# Patient Record
Sex: Male | Born: 1940 | Race: White | Hispanic: No | Marital: Married | Smoking: Former smoker
Health system: Southern US, Community
[De-identification: ages and names within clinical notes are randomized; demographics above are authoritative.]

## PROBLEM LIST (undated history)

## (undated) DIAGNOSIS — R5383 Other fatigue: Secondary | ICD-10-CM

## (undated) DIAGNOSIS — G8929 Other chronic pain: Secondary | ICD-10-CM

## (undated) DIAGNOSIS — C61 Malignant neoplasm of prostate: Secondary | ICD-10-CM

## (undated) DIAGNOSIS — H9190 Unspecified hearing loss, unspecified ear: Secondary | ICD-10-CM

## (undated) DIAGNOSIS — E785 Hyperlipidemia, unspecified: Secondary | ICD-10-CM

## (undated) DIAGNOSIS — I2510 Atherosclerotic heart disease of native coronary artery without angina pectoris: Secondary | ICD-10-CM

## (undated) DIAGNOSIS — I8290 Acute embolism and thrombosis of unspecified vein: Secondary | ICD-10-CM

## (undated) DIAGNOSIS — Z955 Presence of coronary angioplasty implant and graft: Secondary | ICD-10-CM

## (undated) DIAGNOSIS — E7800 Pure hypercholesterolemia, unspecified: Secondary | ICD-10-CM

## (undated) DIAGNOSIS — N529 Male erectile dysfunction, unspecified: Secondary | ICD-10-CM

## (undated) DIAGNOSIS — E119 Type 2 diabetes mellitus without complications: Secondary | ICD-10-CM

## (undated) DIAGNOSIS — I10 Essential (primary) hypertension: Secondary | ICD-10-CM

## (undated) DIAGNOSIS — G4730 Sleep apnea, unspecified: Secondary | ICD-10-CM

## (undated) DIAGNOSIS — M1990 Unspecified osteoarthritis, unspecified site: Secondary | ICD-10-CM

## (undated) DIAGNOSIS — R9721 Rising PSA following treatment for malignant neoplasm of prostate: Secondary | ICD-10-CM

## (undated) DIAGNOSIS — M21371 Foot drop, right foot: Secondary | ICD-10-CM

## (undated) DIAGNOSIS — H409 Unspecified glaucoma: Secondary | ICD-10-CM

## (undated) DIAGNOSIS — N401 Enlarged prostate with lower urinary tract symptoms: Secondary | ICD-10-CM

## (undated) DIAGNOSIS — I2089 Other forms of angina pectoris: Secondary | ICD-10-CM

## (undated) DIAGNOSIS — H269 Unspecified cataract: Secondary | ICD-10-CM

## (undated) DIAGNOSIS — R0609 Other forms of dyspnea: Secondary | ICD-10-CM

## (undated) DIAGNOSIS — E039 Hypothyroidism, unspecified: Secondary | ICD-10-CM

## (undated) DIAGNOSIS — I453 Trifascicular block: Secondary | ICD-10-CM

## (undated) DIAGNOSIS — N183 Chronic kidney disease, stage 3 unspecified: Secondary | ICD-10-CM

## (undated) DIAGNOSIS — K219 Gastro-esophageal reflux disease without esophagitis: Secondary | ICD-10-CM

## (undated) HISTORY — DX: Type 2 diabetes mellitus without complications: E11.9

## (undated) HISTORY — DX: Sleep apnea, unspecified: G47.30

## (undated) HISTORY — DX: Essential (primary) hypertension: I10

## (undated) HISTORY — PX: HERNIA REPAIR: SHX51

## (undated) HISTORY — PX: PROSTATE BIOPSY: SHX241

## (undated) HISTORY — DX: Malignant neoplasm of prostate: C61

## (undated) HISTORY — DX: Acute embolism and thrombosis of unspecified vein: I82.90

## (undated) HISTORY — PX: INGUINAL HERNIA REPAIR: SUR1180

## (undated) HISTORY — DX: Pure hypercholesterolemia, unspecified: E78.00

---

## 1988-11-24 HISTORY — PX: KNEE ARTHROSCOPY W/ MENISCAL REPAIR: SHX1877

## 1988-11-24 HISTORY — PX: KNEE SURGERY: SHX244

## 1999-03-06 DIAGNOSIS — G4700 Insomnia, unspecified: Secondary | ICD-10-CM | POA: Insufficient documentation

## 2000-05-28 DIAGNOSIS — I10 Essential (primary) hypertension: Secondary | ICD-10-CM | POA: Insufficient documentation

## 2000-12-09 DIAGNOSIS — G5600 Carpal tunnel syndrome, unspecified upper limb: Secondary | ICD-10-CM | POA: Insufficient documentation

## 2005-02-07 DIAGNOSIS — L649 Androgenic alopecia, unspecified: Secondary | ICD-10-CM | POA: Insufficient documentation

## 2005-03-11 DIAGNOSIS — K5730 Diverticulosis of large intestine without perforation or abscess without bleeding: Secondary | ICD-10-CM | POA: Insufficient documentation

## 2005-03-26 DIAGNOSIS — G4733 Obstructive sleep apnea (adult) (pediatric): Secondary | ICD-10-CM

## 2005-03-26 DIAGNOSIS — G4730 Sleep apnea, unspecified: Secondary | ICD-10-CM

## 2005-03-26 HISTORY — DX: Sleep apnea, unspecified: G47.30

## 2005-03-26 HISTORY — DX: Obstructive sleep apnea (adult) (pediatric): G47.33

## 2009-01-11 DIAGNOSIS — N529 Male erectile dysfunction, unspecified: Secondary | ICD-10-CM | POA: Insufficient documentation

## 2009-03-26 DIAGNOSIS — M21371 Foot drop, right foot: Secondary | ICD-10-CM

## 2009-03-26 HISTORY — PX: HIP SURGERY: SHX245

## 2009-03-26 HISTORY — PX: TOTAL HIP ARTHROPLASTY: SHX124

## 2009-03-26 HISTORY — DX: Foot drop, right foot: M21.371

## 2009-10-24 DIAGNOSIS — Z87828 Personal history of other (healed) physical injury and trauma: Secondary | ICD-10-CM

## 2009-10-24 DIAGNOSIS — Z86718 Personal history of other venous thrombosis and embolism: Secondary | ICD-10-CM

## 2009-10-24 DIAGNOSIS — Z8739 Personal history of other diseases of the musculoskeletal system and connective tissue: Secondary | ICD-10-CM

## 2009-10-24 HISTORY — DX: Personal history of other (healed) physical injury and trauma: Z87.828

## 2009-10-24 HISTORY — DX: Personal history of other venous thrombosis and embolism: Z86.718

## 2009-10-24 HISTORY — DX: Personal history of other diseases of the musculoskeletal system and connective tissue: Z87.39

## 2009-11-04 HISTORY — PX: ORIF ACETABULUM FRACTURE: SUR917

## 2009-12-23 HISTORY — PX: TOTAL HIP ARTHROPLASTY: SHX124

## 2012-03-26 HISTORY — PX: NASAL SINUS SURGERY: SHX719

## 2012-03-26 HISTORY — PX: NASAL SEPTUM SURGERY: SHX37

## 2012-03-26 HISTORY — PX: COLONOSCOPY: SHX174

## 2013-03-26 HISTORY — PX: CATARACT EXTRACTION W/ INTRAOCULAR LENS IMPLANT: SHX1309

## 2014-01-26 DIAGNOSIS — E039 Hypothyroidism, unspecified: Secondary | ICD-10-CM | POA: Insufficient documentation

## 2015-03-02 ENCOUNTER — Ambulatory Visit (INDEPENDENT_AMBULATORY_CARE_PROVIDER_SITE_OTHER): Payer: Medicare Other | Admitting: Urology

## 2015-03-02 DIAGNOSIS — C61 Malignant neoplasm of prostate: Secondary | ICD-10-CM | POA: Diagnosis not present

## 2015-03-02 DIAGNOSIS — R972 Elevated prostate specific antigen [PSA]: Secondary | ICD-10-CM | POA: Diagnosis not present

## 2015-04-01 ENCOUNTER — Encounter: Payer: Self-pay | Admitting: Radiation Oncology

## 2015-04-01 NOTE — Progress Notes (Signed)
GU Location of Tumor / Histology: prostatic adenocarcinoma  If Prostate Cancer, Gleason Score is (3 + 3) and PSA is (5.07)  Marco Brown was found to have an elevated PSA of 3.9 one year ago. Dr. Tressie Stalker referred the patient to Dr. Alyson Ingles when his PSA rose to 5.01 December 2014.  Biopsies of prostate (if applicable) revealed:    Past/Anticipated interventions by urology, if any: prostate biopsy, lengthy discussion about radiation versus prostatectomy  Past/Anticipated interventions by medical oncology, if any: no  Weight changes, if any: no  Bowel/Bladder complaints, if any: yes, mild LUTS. Reports intermittent and weak urine stream. Reports urgency. Denies hematuria or dysuria. Reports intermittent diarrhea.  Nausea/Vomiting, if any: no  Pain issues, if any:  no  SAFETY ISSUES:  Prior radiation? no  Pacemaker/ICD? no  Possible current pregnancy? no  Is the patient on methotrexate? no  Current Complaints / other details:  75 year old male. PROSTATE VOLUME: 41.8 cc. Married. Retired. Denies family hx of breast or prostate ca. Leaning toward seeds.

## 2015-04-04 ENCOUNTER — Ambulatory Visit
Admission: RE | Admit: 2015-04-04 | Discharge: 2015-04-04 | Disposition: A | Discharge status: Home / Self Care | Payer: Medicare Other | Source: Ambulatory Visit | Attending: Radiation Oncology | Admitting: Radiation Oncology

## 2015-04-04 ENCOUNTER — Telehealth: Payer: Self-pay | Admitting: Radiation Oncology

## 2015-04-04 ENCOUNTER — Encounter: Payer: Self-pay | Admitting: Radiation Oncology

## 2015-04-04 VITALS — BP 147/75 | HR 89 | Resp 16 | Wt 180.4 lb

## 2015-04-04 DIAGNOSIS — Z8546 Personal history of malignant neoplasm of prostate: Secondary | ICD-10-CM | POA: Insufficient documentation

## 2015-04-04 DIAGNOSIS — C61 Malignant neoplasm of prostate: Secondary | ICD-10-CM | POA: Diagnosis not present

## 2015-04-04 DIAGNOSIS — Z79899 Other long term (current) drug therapy: Secondary | ICD-10-CM | POA: Diagnosis not present

## 2015-04-04 DIAGNOSIS — Z9889 Other specified postprocedural states: Secondary | ICD-10-CM | POA: Insufficient documentation

## 2015-04-04 DIAGNOSIS — E7800 Pure hypercholesterolemia, unspecified: Secondary | ICD-10-CM | POA: Insufficient documentation

## 2015-04-04 DIAGNOSIS — Z87891 Personal history of nicotine dependence: Secondary | ICD-10-CM | POA: Insufficient documentation

## 2015-04-04 DIAGNOSIS — E119 Type 2 diabetes mellitus without complications: Secondary | ICD-10-CM | POA: Diagnosis not present

## 2015-04-04 DIAGNOSIS — Z96649 Presence of unspecified artificial hip joint: Secondary | ICD-10-CM | POA: Diagnosis not present

## 2015-04-04 DIAGNOSIS — I10 Essential (primary) hypertension: Secondary | ICD-10-CM | POA: Diagnosis not present

## 2015-04-04 DIAGNOSIS — Z7984 Long term (current) use of oral hypoglycemic drugs: Secondary | ICD-10-CM | POA: Insufficient documentation

## 2015-04-04 DIAGNOSIS — Z510 Encounter for antineoplastic radiation therapy: Secondary | ICD-10-CM | POA: Diagnosis not present

## 2015-04-04 NOTE — Telephone Encounter (Signed)
Confirmed consult appointment for today.

## 2015-04-04 NOTE — Progress Notes (Signed)
See progress note under physician encounter. 

## 2015-04-04 NOTE — Progress Notes (Signed)
Radiation Oncology         [phone] ________________________________  Initial Outpatient Consultation  Name: Marco Brown MRN: [id number]  Date: 04/04/2015  DOB: 1940-08-14  CC:No primary care provider on file.  McKenzie, Candee Furbish, MD   REFERRING PHYSICIAN: Cleon Gustin, MD  DIAGNOSIS: 75 y.o. gentleman with stage T1c adenocarcinoma of the prostate with a Gleason's score of 3+3 and a PSA of 5.07    ICD-9-CM ICD-10-CM   1. Malignant neoplasm of prostate ([place]) [place] ILLNESS::Marco Brown is a 75 y.o. gentleman.  He was noted to have an elevated PSA of 5.07 by his primary care physician, Dr. Denyce Robert.  Accordingly, he was referred for evaluation in urology by Dr. Doroteo Bradford on 01/20/15,  digital rectal examination was performed at that time revealing no nodules.  The patient proceeded to transrectal ultrasound with 12 biopsies of the prostate on 02/15/15.  The prostate volume measured 41.8 cc.  Out of 12 core biopsies, 6 were positive.  The maximum Gleason score was 3+3, and this was seen in all of the left sided specimens.  The patient reviewed the biopsy results with his urologist and he has kindly been referred today for discussion of potential radiation treatment options. He presents to the clinic with his wife.  PREVIOUS RADIATION THERAPY: No  PAST MEDICAL HISTORY:  has a past medical history of Prostate cancer ([place]); Diabetes mellitus without complication ([place]); Hypercholesterolemia; Hypertension; Sleep apnea; and Thrombus.    PAST SURGICAL HISTORY: Past Surgical History  Procedure Laterality Date  . Hernia repair    . Hip surgery    . Knee surgery    . Total hip arthroplasty    . Prostate biopsy      FAMILY HISTORY: family history includes Cancer in his father and paternal grandfather; Stroke in his father and mother.  SOCIAL HISTORY:  reports that he quit smoking about 50 years ago. His smoking use included Cigarettes, Cigars, and Pipe.  He smoked 1.50 packs per day. He has never used smokeless tobacco. He reports that he drinks alcohol. He reports that he does not use illicit drugs.  ALLERGIES: Review of patient's allergies indicates no known allergies.  MEDICATIONS:  Current Outpatient Prescriptions  Medication Sig Dispense Refill  . glimepiride (AMARYL) 2 MG tablet TK 1 AND 1/2 TS PO QD  3  . latanoprost (XALATAN) 0.005 % ophthalmic solution PLACE 1 DROP INTO OU HS  0  . levothyroxine (SYNTHROID, LEVOTHROID) 150 MCG tablet TK 1 T PO QD  3  . losartan-hydrochlorothiazide (HYZAAR) 100-12.5 MG tablet TK 1 T PO QD  0  . metFORMIN (GLUCOPHAGE-XR) 500 MG 24 hr tablet TK 1 T PO D  1  . pravastatin (PRAVACHOL) 20 MG tablet TK 1 T PO  QD WITH DINNER  2  . amoxicillin-clavulanate (AUGMENTIN) 875-125 MG tablet Reported on 04/04/2015  0   No current facility-administered medications for this encounter.    REVIEW OF SYSTEMS:  A 15 point review of systems is documented in the electronic medical record. This was obtained by the nursing staff. However, I reviewed this with the patient to discuss relevant findings and make appropriate changes.  Pertinent items are noted in HPI..  The patient completed an IPSS and IIEF questionnaire.  His IPSS score was 4 indicating mild urinary outflow obstructive symptoms.  He indicated that his erectile function is able to complete sexual activity about half the time.   PHYSICAL EXAM: This patient  is in no acute distress.  He is alert and oriented.   weight is 180 lb 6.4 oz (81.829 kg). His blood pressure is 147/75 and his pulse is 89. His respiration is 16 and oxygen saturation is 100%.  He exhibits no respiratory distress or labored breathing.  He appears neurologically intact.  His mood is pleasant.  His affect is appropriate.  Please note the digital rectal exam findings described above.  KPS = 100  100 - Normal; no complaints; no evidence of disease. 90   - Able to carry on normal activity; minor  signs or symptoms of disease. 80   - Normal activity with effort; some signs or symptoms of disease. 22   - Cares for self; unable to carry on normal activity or to do active work. 60   - Requires occasional assistance, but is able to care for most of his personal needs. 50   - Requires considerable assistance and frequent medical care. 43   - Disabled; requires special care and assistance. 61   - Severely disabled; hospital admission is indicated although death not imminent. 39   - Very sick; hospital admission necessary; active supportive treatment necessary. 10   - Moribund; fatal processes progressing rapidly. 0     - Dead  Karnofsky DA, Abelmann WH, Craver LS and Burchenal JH [phone]) The use of the nitrogen mustards in the palliative treatment of carcinoma: with particular reference to bronchogenic carcinoma Cancer 1 [phone]   LABORATORY DATA:  No results found for: WBC, HGB, HCT, MCV, PLT No results found for: NA, K, CL, CO2 No results found for: ALT, AST, GGT, ALKPHOS, BILITOT   RADIOGRAPHY: No results found.    IMPRESSION: This is a 75 y.o. gentleman with stage T1c adenocarcinoma of the prostate with a Gleason's score of 3+3 and a PSA of 5.07.  His T-Stage, Gleason's Score, and PSA put him into the favorable risk group.  Accordingly he is eligible for a variety of potential treatment options including active surveillance, prostatectomy, external beam radiation, or seed implant.  PLAN: Today I reviewed the findings and workup thus far. We discussed the natural history of prostate cancer.  We reviewed the the implications of T-stage, Gleason's Score, and PSA on decision-making and outcomes in prostate cancer.  We discussed radiation treatment in the management of prostate cancer with regard to the logistics and delivery of external beam radiation treatment as well as the logistics and delivery of prostate brachytherapy.  We compared and contrasted each of these approaches and also compared  these against prostatectomy.    He has yet to decide what treatment option he wants, but is leaning towards seed implant or active surveillance. He will follow-up with Dr. Alyson Ingles next week for further discussion. I will share my findings with Dr. Alyson Ingles. I gave him our phone number to contact us of his decision.  I enjoyed meeting with him today, and will look forward to participating in the care of this very nice gentleman.  I spent 30 minutes face to face with the patient and more than 50% of that time was spent in counseling and/or coordination of care.   ------------------------------------------------  Sheral Apley. Tammi Klippel, M.D.  This document serves as a record of services personally performed by Tyler Pita, MD. It was created on his behalf by Darcus Austin, a trained medical scribe. The creation of this record is based on the scribe's personal observations and the provider's statements to them. This document has been checked and approved  by the attending provider.

## 2015-04-04 NOTE — Progress Notes (Signed)
Patient denies incontinence or leakage. Denies hematuria or dysuria.

## 2015-04-29 ENCOUNTER — Telehealth: Payer: Self-pay | Admitting: *Deleted

## 2015-04-29 NOTE — Telephone Encounter (Signed)
CALLED PATIENT TO INFORM OF PRE-SEED APPT. FOR 05-20-15, SPOKE WITH PATIENT AND HE IS AWARE OF THIS APPT.

## 2015-05-09 ENCOUNTER — Other Ambulatory Visit: Payer: Self-pay | Admitting: Urology

## 2015-05-09 ENCOUNTER — Telehealth: Payer: Self-pay | Admitting: *Deleted

## 2015-05-09 NOTE — Telephone Encounter (Signed)
Called patient to inform of pre-seed appt. And his implant, lvm for a return call

## 2015-05-19 ENCOUNTER — Telehealth: Payer: Self-pay | Admitting: *Deleted

## 2015-05-19 NOTE — Telephone Encounter (Signed)
XXX

## 2015-05-19 NOTE — Telephone Encounter (Signed)
CALLED PATIENT TO REMIND OF APPTS. FOR 05-20-15, LVM FOR A RETURN CALL

## 2015-05-19 NOTE — Telephone Encounter (Deleted)
CALLED PATIENT TO REMIND OF APPTS. FOR 24

## 2015-05-19 NOTE — Telephone Encounter (Signed)
XXXX 

## 2015-05-20 ENCOUNTER — Ambulatory Visit
Admission: RE | Admit: 2015-05-20 | Discharge: 2015-05-20 | Disposition: A | Discharge status: Home / Self Care | Payer: Medicare Other | Source: Ambulatory Visit | Attending: Radiation Oncology | Admitting: Radiation Oncology

## 2015-05-20 ENCOUNTER — Other Ambulatory Visit: Payer: Self-pay

## 2015-05-20 ENCOUNTER — Ambulatory Visit
Admission: RE | Admit: 2015-05-20 | Discharge: 2015-05-20 | Disposition: A | Discharge status: Home / Self Care | Payer: 59 | Source: Ambulatory Visit | Attending: Radiation Oncology | Admitting: Radiation Oncology

## 2015-05-20 ENCOUNTER — Ambulatory Visit (HOSPITAL_COMMUNITY)
Admission: RE | Admit: 2015-05-20 | Discharge: 2015-05-20 | Disposition: A | Discharge status: Home / Self Care | Payer: Medicare Other | Source: Ambulatory Visit | Attending: Urology | Admitting: Urology

## 2015-05-20 ENCOUNTER — Encounter (HOSPITAL_BASED_OUTPATIENT_CLINIC_OR_DEPARTMENT_OTHER)
Admission: RE | Admit: 2015-05-20 | Discharge: 2015-05-20 | Disposition: A | Discharge status: Home / Self Care | Payer: Medicare Other | Source: Ambulatory Visit | Attending: Urology | Admitting: Urology

## 2015-05-20 DIAGNOSIS — Z01818 Encounter for other preprocedural examination: Secondary | ICD-10-CM | POA: Insufficient documentation

## 2015-05-20 DIAGNOSIS — C61 Malignant neoplasm of prostate: Secondary | ICD-10-CM | POA: Diagnosis not present

## 2015-05-20 DIAGNOSIS — Z029 Encounter for administrative examinations, unspecified: Secondary | ICD-10-CM | POA: Diagnosis not present

## 2015-05-20 NOTE — Progress Notes (Signed)
  Radiation Oncology         [phone]) [phone] ________________________________  Name: Marco Brown MRN: [id number]  Date: 05/20/2015  DOB: 14-Dec-1940  SIMULATION AND TREATMENT PLANNING NOTE PUBIC ARCH STUDY  CC:No primary care provider on file.  McKenzie, Candee Furbish, MD  DIAGNOSIS: 75 y.o. gentleman with stage T1c adenocarcinoma of the prostate with a Gleason's score of 3+3 and a PSA of 5.07      ICD-9-CM ICD-10-CM   1. Malignant neoplasm of prostate ([place]) [place]:  The patient presented today for evaluation for possible prostate seed implant. He was brought to the radiation planning suite and placed supine on the CT couch. A 3-dimensional image study set was obtained in upload to the planning computer. There, on each axial slice, I contoured the prostate gland. Then, using three-dimensional radiation planning tools I reconstructed the prostate in view of the structures from the transperineal needle pathway to assess for possible pubic arch interference. In doing so, I did not appreciate any pubic arch interference. Also, the patient's prostate volume was estimated based on the drawn structure. The volume was 41 cc.  Given the pubic arch appearance and prostate volume, patient remains a good candidate to proceed with prostate seed implant. Today, he freely provided informed written consent to proceed.    PLAN: The patient will undergo prostate seed implant.   ________________________________  Sheral Apley. Tammi Klippel, M.D.   This document serves as a record of services personally performed by Tyler Pita, MD. It was created on his behalf by Arlyce Harman, a trained medical scribe. The creation of this record is based on the scribe's personal observations and the provider's statements to them. This document has been checked and approved by the attending provider.

## 2015-07-18 ENCOUNTER — Encounter (HOSPITAL_BASED_OUTPATIENT_CLINIC_OR_DEPARTMENT_OTHER): Payer: Self-pay | Admitting: *Deleted

## 2015-07-18 NOTE — Progress Notes (Signed)
To [facility] at 0800-Ekg,CXR with chart-plans visit  07/21/15 for pre- op lab work.Instructed Npo after Mn-will take levothyroxine with water-to complete fleet enema prior to arrival am of procedure.

## 2015-07-20 ENCOUNTER — Telehealth: Payer: Self-pay | Admitting: *Deleted

## 2015-07-20 NOTE — Telephone Encounter (Signed)
CALLED PATIENT TO REMIND OF LAB FOR IMPLANT (LAB TO BE DONE ON 07-21-15 FOR IMPLANT ON 07-28-15), SPOKE WITH PATIENT'S WIFE AND SHE IS AWARE OF THIS LAB.

## 2015-07-21 DIAGNOSIS — Z7984 Long term (current) use of oral hypoglycemic drugs: Secondary | ICD-10-CM | POA: Diagnosis not present

## 2015-07-21 DIAGNOSIS — I10 Essential (primary) hypertension: Secondary | ICD-10-CM | POA: Diagnosis not present

## 2015-07-21 DIAGNOSIS — C61 Malignant neoplasm of prostate: Secondary | ICD-10-CM | POA: Diagnosis present

## 2015-07-21 DIAGNOSIS — Z791 Long term (current) use of non-steroidal anti-inflammatories (NSAID): Secondary | ICD-10-CM | POA: Diagnosis not present

## 2015-07-21 DIAGNOSIS — Z87891 Personal history of nicotine dependence: Secondary | ICD-10-CM | POA: Diagnosis not present

## 2015-07-21 DIAGNOSIS — Z86718 Personal history of other venous thrombosis and embolism: Secondary | ICD-10-CM | POA: Diagnosis not present

## 2015-07-21 DIAGNOSIS — E7800 Pure hypercholesterolemia, unspecified: Secondary | ICD-10-CM | POA: Diagnosis not present

## 2015-07-21 DIAGNOSIS — Z79899 Other long term (current) drug therapy: Secondary | ICD-10-CM | POA: Diagnosis not present

## 2015-07-21 DIAGNOSIS — E119 Type 2 diabetes mellitus without complications: Secondary | ICD-10-CM | POA: Diagnosis not present

## 2015-07-21 DIAGNOSIS — Z8547 Personal history of malignant neoplasm of testis: Secondary | ICD-10-CM | POA: Diagnosis not present

## 2015-07-21 DIAGNOSIS — Z96641 Presence of right artificial hip joint: Secondary | ICD-10-CM | POA: Diagnosis not present

## 2015-07-21 DIAGNOSIS — Z7982 Long term (current) use of aspirin: Secondary | ICD-10-CM | POA: Diagnosis not present

## 2015-07-21 DIAGNOSIS — G4730 Sleep apnea, unspecified: Secondary | ICD-10-CM | POA: Diagnosis not present

## 2015-07-21 LAB — CBC
HEMATOCRIT: 39.5 % (ref 39.0–52.0)
Hemoglobin: 13.3 g/dL (ref 13.0–17.0)
MCH: 29.3 pg (ref 26.0–34.0)
MCHC: 33.7 g/dL (ref 30.0–36.0)
MCV: 87 fL (ref 78.0–100.0)
Platelets: 152 10*3/uL (ref 150–400)
RBC: 4.54 MIL/uL (ref 4.22–5.81)
RDW: 13 % (ref 11.5–15.5)
WBC: 6.2 10*3/uL (ref 4.0–10.5)

## 2015-07-21 LAB — COMPREHENSIVE METABOLIC PANEL
ALT: 20 U/L (ref 17–63)
AST: 21 U/L (ref 15–41)
Albumin: 4.2 g/dL (ref 3.5–5.0)
Alkaline Phosphatase: 47 U/L (ref 38–126)
Anion gap: 9 (ref 5–15)
BILIRUBIN TOTAL: 0.3 mg/dL (ref 0.3–1.2)
BUN: 15 mg/dL (ref 6–20)
CO2: 25 mmol/L (ref 22–32)
CREATININE: 1.2 mg/dL (ref 0.61–1.24)
Calcium: 8.8 mg/dL — ABNORMAL LOW (ref 8.9–10.3)
Chloride: 105 mmol/L (ref 101–111)
GFR, EST NON AFRICAN AMERICAN: 58 mL/min — AB (ref >60)
Glucose, Bld: 211 mg/dL — ABNORMAL HIGH (ref 65–99)
POTASSIUM: 4.2 mmol/L (ref 3.5–5.1)
Sodium: 139 mmol/L (ref 135–145)
TOTAL PROTEIN: 6.7 g/dL (ref 6.5–8.1)

## 2015-07-21 LAB — APTT: APTT: 28 s (ref 24–37)

## 2015-07-21 LAB — PROTIME-INR
INR: 1.07 (ref 0.00–1.49)
Prothrombin Time: 14.1 seconds (ref 11.6–15.2)

## 2015-07-27 ENCOUNTER — Ambulatory Visit: Admission: RE | Admit: 2015-07-27 | Payer: Medicare Other | Source: Ambulatory Visit

## 2015-07-27 ENCOUNTER — Telehealth: Payer: Self-pay | Admitting: *Deleted

## 2015-07-27 NOTE — Telephone Encounter (Signed)
CALLED PATIENT TO REMIND OF PROCEDURE FOR 07-28-15, SPOKE WITH PATIENT'S WIFE- GAIL AND SHE IS AWARE OF THIS PROCEDURE.

## 2015-07-28 ENCOUNTER — Encounter (HOSPITAL_BASED_OUTPATIENT_CLINIC_OR_DEPARTMENT_OTHER)
Admission: RE | Disposition: A | Discharge status: Home / Self Care | Payer: Self-pay | Source: Ambulatory Visit | Attending: Urology

## 2015-07-28 ENCOUNTER — Encounter (HOSPITAL_BASED_OUTPATIENT_CLINIC_OR_DEPARTMENT_OTHER): Payer: Self-pay | Admitting: Anesthesiology

## 2015-07-28 ENCOUNTER — Ambulatory Visit (HOSPITAL_BASED_OUTPATIENT_CLINIC_OR_DEPARTMENT_OTHER): Payer: Medicare Other | Admitting: Anesthesiology

## 2015-07-28 ENCOUNTER — Ambulatory Visit (HOSPITAL_COMMUNITY): Payer: Medicare Other

## 2015-07-28 ENCOUNTER — Ambulatory Visit (HOSPITAL_BASED_OUTPATIENT_CLINIC_OR_DEPARTMENT_OTHER)
Admission: RE | Admit: 2015-07-28 | Discharge: 2015-07-28 | Disposition: A | Discharge status: Home / Self Care | Payer: Medicare Other | Source: Ambulatory Visit | Attending: Urology | Admitting: Urology

## 2015-07-28 DIAGNOSIS — G4730 Sleep apnea, unspecified: Secondary | ICD-10-CM | POA: Diagnosis not present

## 2015-07-28 DIAGNOSIS — Z87891 Personal history of nicotine dependence: Secondary | ICD-10-CM | POA: Insufficient documentation

## 2015-07-28 DIAGNOSIS — C61 Malignant neoplasm of prostate: Secondary | ICD-10-CM | POA: Diagnosis not present

## 2015-07-28 DIAGNOSIS — Z01818 Encounter for other preprocedural examination: Secondary | ICD-10-CM

## 2015-07-28 DIAGNOSIS — Z791 Long term (current) use of non-steroidal anti-inflammatories (NSAID): Secondary | ICD-10-CM | POA: Insufficient documentation

## 2015-07-28 DIAGNOSIS — Z79899 Other long term (current) drug therapy: Secondary | ICD-10-CM | POA: Insufficient documentation

## 2015-07-28 DIAGNOSIS — Z7984 Long term (current) use of oral hypoglycemic drugs: Secondary | ICD-10-CM | POA: Insufficient documentation

## 2015-07-28 DIAGNOSIS — Z7982 Long term (current) use of aspirin: Secondary | ICD-10-CM | POA: Insufficient documentation

## 2015-07-28 DIAGNOSIS — E119 Type 2 diabetes mellitus without complications: Secondary | ICD-10-CM | POA: Diagnosis not present

## 2015-07-28 DIAGNOSIS — Z86718 Personal history of other venous thrombosis and embolism: Secondary | ICD-10-CM | POA: Insufficient documentation

## 2015-07-28 DIAGNOSIS — I10 Essential (primary) hypertension: Secondary | ICD-10-CM | POA: Insufficient documentation

## 2015-07-28 DIAGNOSIS — Z8547 Personal history of malignant neoplasm of testis: Secondary | ICD-10-CM | POA: Insufficient documentation

## 2015-07-28 DIAGNOSIS — E7800 Pure hypercholesterolemia, unspecified: Secondary | ICD-10-CM | POA: Insufficient documentation

## 2015-07-28 DIAGNOSIS — Z96641 Presence of right artificial hip joint: Secondary | ICD-10-CM | POA: Insufficient documentation

## 2015-07-28 HISTORY — PX: CYSTOSCOPY: SHX5120

## 2015-07-28 HISTORY — DX: Unspecified cataract: H26.9

## 2015-07-28 HISTORY — PX: RADIOACTIVE SEED IMPLANT: SHX5150

## 2015-07-28 LAB — GLUCOSE, CAPILLARY
GLUCOSE-CAPILLARY: 211 mg/dL — AB (ref 65–99)
Glucose-Capillary: 201 mg/dL — ABNORMAL HIGH (ref 65–99)

## 2015-07-28 SURGERY — INSERTION, RADIATION SOURCE, PROSTATE
Anesthesia: General | Site: Prostate

## 2015-07-28 MED ORDER — LIDOCAINE HCL (CARDIAC) 20 MG/ML IV SOLN
INTRAVENOUS | Status: DC | PRN
  Administered 2015-07-28: 80 mg via INTRAVENOUS

## 2015-07-28 MED ORDER — ACETAMINOPHEN 325 MG PO TABS
325.0000 mg | ORAL_TABLET | ORAL | Status: DC | PRN
  Filled 2015-07-28: qty 2

## 2015-07-28 MED ORDER — ACETAMINOPHEN 160 MG/5ML PO SOLN
325.0000 mg | ORAL | Status: DC | PRN
  Filled 2015-07-28: qty 20.3

## 2015-07-28 MED ORDER — FLEET ENEMA 7-19 GM/118ML RE ENEM
1.0000 | ENEMA | Freq: Once | RECTAL | Status: AC
  Administered 2015-07-28: 1 via RECTAL
  Filled 2015-07-28: qty 1

## 2015-07-28 MED ORDER — KETOROLAC TROMETHAMINE 30 MG/ML IJ SOLN
INTRAMUSCULAR | Status: DC | PRN
  Administered 2015-07-28: 30 mg via INTRAVENOUS

## 2015-07-28 MED ORDER — PROPOFOL 10 MG/ML IV BOLUS
INTRAVENOUS | Status: AC
  Filled 2015-07-28: qty 20

## 2015-07-28 MED ORDER — FENTANYL CITRATE (PF) 100 MCG/2ML IJ SOLN
25.0000 ug | INTRAMUSCULAR | Status: DC | PRN
  Filled 2015-07-28: qty 1

## 2015-07-28 MED ORDER — LACTATED RINGERS IV SOLN
INTRAVENOUS | Status: DC
  Administered 2015-07-28: 09:00:00 via INTRAVENOUS
  Filled 2015-07-28: qty 1000

## 2015-07-28 MED ORDER — TRAMADOL HCL 50 MG PO TABS: 50.0000 mg | ORAL_TABLET | Freq: Four times a day (QID) | ORAL | Status: DC | PRN

## 2015-07-28 MED ORDER — ONDANSETRON HCL 4 MG/2ML IJ SOLN
INTRAMUSCULAR | Status: AC
  Filled 2015-07-28: qty 2

## 2015-07-28 MED ORDER — ONDANSETRON HCL 4 MG/2ML IJ SOLN
INTRAMUSCULAR | Status: DC | PRN
  Administered 2015-07-28: 4 mg via INTRAVENOUS

## 2015-07-28 MED ORDER — OXYCODONE HCL 5 MG PO TABS
5.0000 mg | ORAL_TABLET | Freq: Once | ORAL | Status: DC | PRN
  Filled 2015-07-28: qty 1

## 2015-07-28 MED ORDER — LIDOCAINE HCL (CARDIAC) 20 MG/ML IV SOLN
INTRAVENOUS | Status: AC
  Filled 2015-07-28: qty 5

## 2015-07-28 MED ORDER — FENTANYL CITRATE (PF) 100 MCG/2ML IJ SOLN
INTRAMUSCULAR | Status: AC
  Filled 2015-07-28: qty 2

## 2015-07-28 MED ORDER — OXYCODONE HCL 5 MG/5ML PO SOLN
5.0000 mg | Freq: Once | ORAL | Status: DC | PRN
  Filled 2015-07-28: qty 5

## 2015-07-28 MED ORDER — EPHEDRINE SULFATE 50 MG/ML IJ SOLN
INTRAMUSCULAR | Status: DC | PRN
  Administered 2015-07-28 (×3): 10 mg via INTRAVENOUS

## 2015-07-28 MED ORDER — PROPOFOL 10 MG/ML IV BOLUS
INTRAVENOUS | Status: DC | PRN
  Administered 2015-07-28: 200 mg via INTRAVENOUS

## 2015-07-28 MED ORDER — GENTAMICIN SULFATE 40 MG/ML IJ SOLN
5.0000 mg/kg | INTRAMUSCULAR | Status: AC
  Administered 2015-07-28: 400 mg via INTRAVENOUS
  Filled 2015-07-28: qty 10

## 2015-07-28 MED ORDER — FENTANYL CITRATE (PF) 100 MCG/2ML IJ SOLN
INTRAMUSCULAR | Status: DC | PRN
  Administered 2015-07-28 (×4): 25 ug via INTRAVENOUS

## 2015-07-28 MED ORDER — GENTAMICIN SULFATE 40 MG/ML IJ SOLN
80.0000 mg | Freq: Once | INTRAVENOUS | Status: DC
  Filled 2015-07-28: qty 2

## 2015-07-28 SURGICAL SUPPLY — 35 items
BAG URINE DRAINAGE (UROLOGICAL SUPPLIES) ×4 IMPLANT
BLADE CLIPPER SURG (BLADE) ×4 IMPLANT
CATH FOLEY 2WAY SLVR  5CC 16FR (CATHETERS) ×2
CATH FOLEY 2WAY SLVR 5CC 16FR (CATHETERS) ×2 IMPLANT
CATH ROBINSON RED A/P 20FR (CATHETERS) ×4 IMPLANT
CLOTH BEACON ORANGE TIMEOUT ST (SAFETY) ×4 IMPLANT
COVER BACK TABLE 60X90IN (DRAPES) ×4 IMPLANT
COVER MAYO STAND STRL (DRAPES) ×4 IMPLANT
DRSG TEGADERM 4X4.75 (GAUZE/BANDAGES/DRESSINGS) ×4 IMPLANT
DRSG TEGADERM 8X12 (GAUZE/BANDAGES/DRESSINGS) ×4 IMPLANT
GLOVE BIO SURGEON STRL SZ 6.5 (GLOVE) ×3 IMPLANT
GLOVE BIO SURGEON STRL SZ7.5 (GLOVE) IMPLANT
GLOVE BIO SURGEON STRL SZ8 (GLOVE) ×8 IMPLANT
GLOVE BIO SURGEONS STRL SZ 6.5 (GLOVE) ×1
GLOVE BIOGEL PI IND STRL 6.5 (GLOVE) ×2 IMPLANT
GLOVE BIOGEL PI IND STRL 7.5 (GLOVE) ×2 IMPLANT
GLOVE BIOGEL PI INDICATOR 6.5 (GLOVE) ×2
GLOVE BIOGEL PI INDICATOR 7.5 (GLOVE) ×2
GLOVE ECLIPSE 8.0 STRL XLNG CF (GLOVE) IMPLANT
GOWN STRL REUS W/ TWL LRG LVL3 (GOWN DISPOSABLE) ×2 IMPLANT
GOWN STRL REUS W/ TWL XL LVL3 (GOWN DISPOSABLE) ×2 IMPLANT
GOWN STRL REUS W/TWL LRG LVL3 (GOWN DISPOSABLE) ×6 IMPLANT
GOWN STRL REUS W/TWL XL LVL3 (GOWN DISPOSABLE) ×2
HOLDER FOLEY CATH W/STRAP (MISCELLANEOUS) ×4 IMPLANT
KIT ROOM TURNOVER WOR (KITS) ×4 IMPLANT
MANIFOLD NEPTUNE II (INSTRUMENTS) IMPLANT
NUCLETRON SELECTSEED 1-125 ×300 IMPLANT
PACK CYSTO (CUSTOM PROCEDURE TRAY) ×4 IMPLANT
SPONGE GAUZE 4X4 12PLY STER LF (GAUZE/BANDAGES/DRESSINGS) ×4 IMPLANT
SYRINGE 10CC LL (SYRINGE) ×4 IMPLANT
TUBE CONNECTING 12'X1/4 (SUCTIONS)
TUBE CONNECTING 12X1/4 (SUCTIONS) IMPLANT
UNDERPAD 30X30 INCONTINENT (UNDERPADS AND DIAPERS) ×8 IMPLANT
WATER STERILE IRR 3000ML UROMA (IV SOLUTION) ×4 IMPLANT
WATER STERILE IRR 500ML POUR (IV SOLUTION) ×4 IMPLANT

## 2015-07-28 NOTE — Anesthesia Preprocedure Evaluation (Signed)
Anesthesia Evaluation  Patient identified by MRN, date of birth, ID band Patient awake    Reviewed: Allergy & Precautions, NPO status , Patient's Chart, lab work & pertinent test results  History of Anesthesia Complications Negative for: history of anesthetic complications  Airway Mallampati: I  TM Distance: >3 FB Neck ROM: Full    Dental  (+) Teeth Intact   Pulmonary neg shortness of breath, sleep apnea , neg COPD, former smoker,    breath sounds clear to auscultation       Cardiovascular hypertension, Pt. on medications (-) angina(-) CHF  Rhythm:Regular     Neuro/Psych Right foot drop  Neuromuscular disease negative psych ROS   GI/Hepatic negative GI ROS, Neg liver ROS,   Endo/Other  diabetes, Type 2, Oral Hypoglycemic AgentsHypothyroidism   Renal/GU negative Renal ROS     Musculoskeletal   Abdominal   Peds  Hematology   Anesthesia Other Findings   Reproductive/Obstetrics                             Anesthesia Physical Anesthesia Plan  ASA: II  Anesthesia Plan: General   Post-op Pain Management:    Induction: Intravenous  Airway Management Planned: LMA and Oral ETT  Additional Equipment: None  Intra-op Plan:   Post-operative Plan: Extubation in OR  Informed Consent: I have reviewed the patients History and Physical, chart, labs and discussed the procedure including the risks, benefits and alternatives for the proposed anesthesia with the patient or authorized representative who has indicated his/her understanding and acceptance.   Dental advisory given  Plan Discussed with: CRNA and Surgeon  Anesthesia Plan Comments:         Anesthesia Quick Evaluation

## 2015-07-28 NOTE — Discharge Instructions (Signed)
Post Anesthesia Home Care Instructions  Activity: Get plenty of rest for the remainder of the day. A responsible adult should stay with you for 24 hours following the procedure.  For the next 24 hours, DO NOT: -Drive a car -Paediatric nurse -Drink alcoholic beverages -Take any medication unless instructed by your physician -Make any legal decisions or sign important papers.  Meals: Start with liquid foods such as gelatin or soup. Progress to regular foods as tolerated. Avoid greasy, spicy, heavy foods. If nausea and/or vomiting occur, drink only clear liquids until the nausea and/or vomiting subsides. Call your physician if vomiting continues.  Special Instructions/Symptoms: Your throat may feel dry or sore from the anesthesia or the breathing tube placed in your throat during surgery. If this causes discomfort, gargle with warm salt water. The discomfort should disappear within 24 hours.  If you had a scopolamine patch placed behind your ear for the management of post- operative nausea and/or vomiting:  1. The medication in the patch is effective for 72 hours, after which it should be removed.  Wrap patch in a tissue and discard in the trash. Wash hands thoroughly with soap and water. 2. You may remove the patch earlier than 72 hours if you experience unpleasant side effects which may include dry mouth, dizziness or visual disturbances. 3. Avoid touching the patch. Wash your hands with soap and water after contact with the patch.   Brachytherapy for Prostate Cancer, Care After Refer to this sheet in the next few weeks. These instructions provide you with information on caring for yourself after your procedure. Your health care provider may also give you more specific instructions. Your treatment has been planned according to current medical practices, but problems sometimes occur. Call your health care provider if you have any problems or questions after your procedure. WHAT TO EXPECT  AFTER THE PROCEDURE The area behind the scrotum will probably be tender and bruised. For a short period of time you may have:  Difficulty passing urine. You may need a catheter for a few days to a month.  Blood in the urine or semen.  A feeling of constipation because of prostate swelling.  Frequent feeling of an urgent need to urinate. For a long period of time you may have:  Inflammation of the rectum. This happens in about 2% of people who have the procedure.  Erection problems. These vary with age and occur in about 15-40% of men.  Difficulty urinating. This is caused by scarring in the urethra.  Diarrhea. HOME CARE INSTRUCTIONS   Take medicines only as directed by your health care provider.  You will probably have a catheter in your bladder for several days. You will have blood in the urine bag and should drink a lot of fluids to keep it a light red color.  Keep all follow-up visits as directed by your health care provider. If you have a catheter, it will be removed during one of these visits.  Try not to sit directly on the area behind the scrotum. A soft cushion can decrease the discomfort. Ice packs may also be helpful for the discomfort. Do not put ice directly on the skin.  Shower and wash the area behind the scrotum gently. Do not sit in a tub.  If you have had the brachytherapy that uses the seeds, limit your close contact with children and pregnant women for 2 months because of the radiation still in the prostate. After that period of time, the levels drop off  quickly. SEEK IMMEDIATE MEDICAL CARE IF:   You have a fever.  You have chills.  You have shortness of breath.  You have chest pain.  You have thick blood, like tomato juice, in the urine bag.  Your catheter is blocked so urine cannot get into the bag. Your bladder area or lower abdomen may be swollen.  There is excessive bleeding from your rectum. It is normal to have a little blood mixed with your  stool.  There is severe discomfort in the treated area that does not go away with pain medicine.  You have abdominal discomfort.  You have severe nausea or vomiting.  You develop any new or unusual symptoms.   This information is not intended to replace advice given to you by your health care provider. Make sure you discuss any questions you have with your health care provider.   Document Released: 04/14/2010 Document Revised: 04/02/2014 Document Reviewed: 09/02/2012 Elsevier Interactive Patient Education [employer].

## 2015-07-28 NOTE — Brief Op Note (Signed)
07/28/2015  11:00 AM  PATIENT:  Marco Brown  75 y.o. male  PRE-OPERATIVE DIAGNOSIS:  PROSTATE CANCER  POST-OPERATIVE DIAGNOSIS:  PROSTATE CANCER  PROCEDURE:  Procedure(s) with comments: RADIOACTIVE SEED IMPLANT/BRACHYTHERAPY IMPLANT (N/A) -   76     SEEDS IMPLANTED CYSTOSCOPY (N/A) - NO SEEDS FOUND IN BLADDER  SURGEON:  Surgeon(s) and Role:    * Cleon Gustin, MD - Primary    * Tyler Pita, MD - Assisting  PHYSICIAN ASSISTANT:   ASSISTANTS: none   ANESTHESIA:   general  EBL:  Total I/O In: 200 [I.V.:200] Out: 200 [Urine:200]  BLOOD ADMINISTERED:none  DRAINS: Urinary Catheter (Foley)   LOCAL MEDICATIONS USED:  NONE  SPECIMEN:  No Specimen  DISPOSITION OF SPECIMEN:  N/A  COUNTS:  YES  TOURNIQUET:  * No tourniquets in log *  DICTATION: .Note written in EPIC  PLAN OF CARE: Discharge to home after PACU  PATIENT DISPOSITION:  PACU - hemodynamically stable.   Delay start of Pharmacological VTE agent (>24hrs) due to surgical blood loss or risk of bleeding: not applicable
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
## 2015-07-28 NOTE — Progress Notes (Signed)
  Radiation Oncology         [phone] ________________________________  Name: Marco Brown MRN: [id number]  Date: 07/29/2015  DOB: [birth year]       Prostate Seed Implant  CC:No primary care provider on file.  No ref. provider found  DIAGNOSIS: 75 y.o. gentleman with stage T1c adenocarcinoma of the prostate with a Gleason's score of 3+3 and a PSA of 5.07    ICD-9-CM ICD-10-CM   1. Pre-op testing V72.84 Z01.818 DG Chest 2 View     DG Chest 2 View    PROCEDURE: Insertion of radioactive I-125 seeds into the prostate gland.  RADIATION DOSE: 145 Gy, definitive  TECHNIQUE: Marco Brown was brought to the operating room with the urologist. He was placed in the dorsolithotomy position. He was catheterized and a rectal tube was inserted. The perineum was shaved, prepped and draped. The ultrasound probe was then introduced into the rectum to see the prostate gland.  TREATMENT DEVICE: A needle grid was attached to the ultrasound probe stand and anchor needles were placed.  3D PLANNING: The prostate was imaged in 3D using a sagittal sweep of the prostate probe. These images were transferred to the planning computer. There, the prostate, urethra and rectum were defined on each axial reconstructed image. Then, the software created an optimized 3D plan and a few seed positions were adjusted. The quality of the plan was reviewed using [facility] information for the target and the following two organs at risk:  Urethra and Rectum.  Then the accepted plan was uploaded to the seed Selectron afterloading unit.  PROSTATE VOLUME STUDY:  Using transrectal ultrasound the volume of the prostate was verified to be 50 cc.  SPECIAL TREATMENT PROCEDURE/SUPERVISION AND HANDLING: The Nucletron FIRST system was used to place the needles under sagittal guidance. A total of 21 needles were used to deposit 75 seeds in the prostate gland. The individual seed activity was 0.567 mCi.  COMPLEX SIMULATION: At the end of the  procedure, an anterior radiograph of the pelvis was obtained to document seed positioning and count. Cystoscopy was performed to check the urethra and bladder.  MICRODOSIMETRY: At the end of the procedure, the patient was emitting 0.23 mR/hr at 1 meter. Accordingly, he was considered safe for hospital discharge.  PLAN: The patient will return to the radiation oncology clinic for post implant CT dosimetry in three weeks.   ________________________________  Sheral Apley Tammi Klippel, M.D.

## 2015-07-28 NOTE — Anesthesia Procedure Notes (Signed)
Procedure Name: LMA Insertion Date/Time: 07/28/2015 9:36 AM Performed by: Justice Rocher Pre-anesthesia Checklist: Patient identified, Emergency Drugs available, Suction available and Patient being monitored Patient Re-evaluated:Patient Re-evaluated prior to inductionOxygen Delivery Method: Circle System Utilized Preoxygenation: Pre-oxygenation with 100% oxygen Intubation Type: IV induction Ventilation: Mask ventilation without difficulty LMA: LMA inserted LMA Size: 5.0 Number of attempts: 1 Airway Equipment and Method: Bite block Placement Confirmation: positive ETCO2 Tube secured with: Tape Dental Injury: Teeth and Oropharynx as per pre-operative assessment

## 2015-07-28 NOTE — Op Note (Signed)
PRE-OPERATIVE DIAGNOSIS:  Adenocarcinoma of the prostate, T1c  POST-OPERATIVE DIAGNOSIS:  Same  PROCEDURE:  Procedure(s): 1. I-125 radioactive seed implantation 2. Cystoscopy  SURGEON:  Surgeon(s): Nicolette Bang, MD  Radiation oncologist: Dr. Tyler Pita  ANESTHESIA:  General  EBL:  Minimal  DRAINS: 69 French Foley catheter  INDICATION: Marco Brown is a 75 year old with a history of T1c prostate cancer. After discussing treatment options he has elected to proceed with brachytherapy  Description of procedure: After informed consent the patient was brought to the major OR, placed on the table and administered general anesthesia. He was then moved to the modified lithotomy position with his perineum perpendicular to the floor. His perineum and genitalia were then sterilely prepped. An official timeout was then performed. A 16 French Foley catheter was then placed in the bladder and filled with dilute contrast, a rectal tube was placed in the rectum and the transrectal ultrasound probe was placed in the rectum and affixed to the stand. He was then sterilely draped.  Real time ultrasonography was used along with the seed planning software [employer] Prostate vs. 4.2.2.4. This was used to develop the seed plan including the number of needles as well as number of seeds required for complete and adequate coverage. Real-time ultrasonography was then used along with the previously developed plan and the Nucletron device to implant a total of 75 seeds using 21 needles. This proceeded without difficulty or complication.  A Foley catheter was then removed as well as the transrectal ultrasound probe and rectal probe. Flexible cystoscopy was then performed using the 17 French flexible scope which revealed a normal urethra throughout its length down to the sphincter which appeared intact. The prostatic urethra revealed bilobar hypertrophy but no evidence of obstruction, seeds, spacers or lesions. The  bladder was then entered and fully and systematically inspected. The ureteral orifices were noted to be of normal configuration and position. The mucosa revealed no evidence of tumors. There were also no stones identified within the bladder. I noted no seeds or spacers on the floor of the bladder and retroflexion of the scope revealed no seeds protruding from the base of the prostate.  The cystoscope was then removed and a new 61 French Foley catheter was then inserted and the balloon was filled with 10 cc of sterile water. This was connected to closed system drainage and the patient was awakened and taken to recovery room in stable and satisfactory condition. He tolerated procedure well and there were no intraoperative complications.  Plan: the patient is to be discharged home and followup in 5 days for a voiding trial.

## 2015-07-28 NOTE — Transfer of Care (Signed)
Immediate Anesthesia Transfer of Care Note  Patient: Marco Brown  Procedure(s) Performed: Procedure(s) (LRB): RADIOACTIVE SEED IMPLANT/BRACHYTHERAPY IMPLANT (N/A) CYSTOSCOPY (N/A)  Patient Location: PACU  Anesthesia Type: General  Level of Consciousness: awake, sedated, patient cooperative and responds to stimulation  Airway & Oxygen Therapy: Patient Spontanous Breathing and Patient connected to face mask oxygen  Post-op Assessment: Report given to PACU RN, Post -op Vital signs reviewed and stable and Patient moving all extremities  Post vital signs: Reviewed and stable  Complications: No apparent anesthesia complications

## 2015-07-29 ENCOUNTER — Encounter (HOSPITAL_BASED_OUTPATIENT_CLINIC_OR_DEPARTMENT_OTHER): Payer: Self-pay | Admitting: Urology

## 2015-07-29 NOTE — Anesthesia Postprocedure Evaluation (Signed)
Anesthesia Post Note  Patient: Marco Brown  Procedure(s) Performed: Procedure(s) (LRB): RADIOACTIVE SEED IMPLANT/BRACHYTHERAPY IMPLANT (N/A) CYSTOSCOPY (N/A)  Patient location during evaluation: PACU Anesthesia Type: General Level of consciousness: awake Pain management: pain level controlled Vital Signs Assessment: post-procedure vital signs reviewed and stable Respiratory status: spontaneous breathing Cardiovascular status: stable Postop Assessment: no signs of nausea or vomiting Anesthetic complications: no    Last Vitals:  Filed Vitals:   07/28/15 1200 07/28/15 1245  BP:  153/69  Pulse: 81 72  Temp:  36.9 C  Resp: 18 14    Last Pain:  Filed Vitals:   07/28/15 1253  PainSc: 0-No pain                 Jomes Giraldo

## 2015-08-18 ENCOUNTER — Telehealth: Payer: Self-pay | Admitting: *Deleted

## 2015-08-18 NOTE — Telephone Encounter (Signed)
CALLED PATIENT TO REMIND OF APPTS. FOR 08-19-15, LVM FOR A RETURN CALL

## 2015-08-19 ENCOUNTER — Ambulatory Visit
Admit: 2015-08-19 | Discharge: 2015-08-19 | Disposition: A | Discharge status: Home / Self Care | Payer: 59 | Attending: Radiation Oncology | Admitting: Radiation Oncology

## 2015-08-19 ENCOUNTER — Encounter: Payer: Self-pay | Admitting: Radiation Oncology

## 2015-08-19 ENCOUNTER — Ambulatory Visit
Admission: RE | Admit: 2015-08-19 | Discharge: 2015-08-19 | Disposition: A | Discharge status: Home / Self Care | Payer: Medicare Other | Source: Ambulatory Visit | Attending: Radiation Oncology | Admitting: Radiation Oncology

## 2015-08-19 VITALS — BP 159/71 | HR 73 | Resp 16 | Wt 183.1 lb

## 2015-08-19 DIAGNOSIS — Z510 Encounter for antineoplastic radiation therapy: Secondary | ICD-10-CM | POA: Insufficient documentation

## 2015-08-19 DIAGNOSIS — C61 Malignant neoplasm of prostate: Secondary | ICD-10-CM | POA: Diagnosis present

## 2015-08-19 NOTE — Progress Notes (Signed)
Weight and vitals stable. Denies pain. Pre seed IPSS 4 but, post seed IPSS 16. Patient's biggest complaint is an intermittent weak stream. Reports mild dysuria just prior and right after each void. Reports urgency. Denies leakage or incontinence. Denies hematuria. Reports incomplete emptying about half the time. Reports nocturia x 1.   BP 159/71 mmHg  Pulse 73  Resp 16  Wt 183 lb 1.6 oz (83.054 kg)  SpO2 100% Wt Readings from Last 3 Encounters:  08/19/15 183 lb 1.6 oz (83.054 kg)  07/28/15 176 lb (79.833 kg)  04/04/15 180 lb 6.4 oz (81.829 kg)

## 2015-08-19 NOTE — Progress Notes (Signed)
  Radiation Oncology         [phone]) [phone] ________________________________  Name: Marco Brown MRN: [id number]  Date: 08/19/2015  DOB: [birth year]  COMPLEX SIMULATION NOTE  NARRATIVE:  The patient was brought to the [facility] today following prostate seed implantation approximately one month ago.  Identity was confirmed.  All relevant records and images related to the planned course of therapy were reviewed.  Then, the patient was set-up supine.  CT images were obtained.  The CT images were loaded into the planning software.  Then the prostate and rectum were contoured.  Treatment planning then occurred.  The implanted iodine 125 seeds were identified by the physics staff for projection of radiation distribution  I have requested : 3D Simulation  I have requested a DVH of the following structures: Prostate and rectum.    ________________________________  Sheral Apley Tammi Klippel, M.D.    This document serves as a record of services personally performed by Tyler Pita, MD. It was created on his behalf by Lendon Collar, a trained medical scribe. The creation of this record is based on the scribe's personal observations and the provider's statements to them. This document has been checked and approved by the attending provider.

## 2015-08-19 NOTE — Progress Notes (Signed)
Radiation Oncology         [phone]) [phone] ________________________________  Name: Marco Brown MRN: [id number]  Date: 08/19/2015  DOB: 1940-07-05  Follow-Up Visit Note  CC: No primary care provider on file.  McKenzie, Candee Furbish, MD  Diagnosis:   75 y.o. gentleman with stage T1c adenocarcinoma of the prostate with a Gleason's score of 3+3 and a PSA of 5.07    ICD-9-CM ICD-10-CM   1. Malignant neoplasm of prostate (HCC) 185 C61     Interval Since Last Radiation:  2  weeks.  Narrative:  The patient returns today for routine follow-up.  He is complaining of increased urinary frequency and urinary hesitation symptoms. He filled out a questionnaire regarding urinary function today providing and overall IPSS score of 16 characterizing his symptoms as moderate.  His pre-implant score was 4. He denies any bowel symptoms.  ALLERGIES:  has No Known Allergies.  Meds: Current Outpatient Prescriptions  Medication Sig Dispense Refill  . aspirin 81 MG tablet Take 81 mg by mouth daily.    Marland Kitchen glimepiride (AMARYL) 2 MG tablet TK 1 AND 1/2 TS PO QD  3  . ibuprofen (ADVIL,MOTRIN) 800 MG tablet Take 800 mg by mouth every 8 (eight) hours as needed.    . latanoprost (XALATAN) 0.005 % ophthalmic solution PLACE 1 DROP INTO OU HS  0  . levothyroxine (SYNTHROID, LEVOTHROID) 150 MCG tablet TK 1 T PO QD  3  . losartan-hydrochlorothiazide (HYZAAR) 100-12.5 MG tablet TK 1 T PO QD  0  . metFORMIN (GLUCOPHAGE-XR) 500 MG 24 hr tablet TK 1 T PO D  1  . pravastatin (PRAVACHOL) 20 MG tablet TK 1 T PO  QD WITH DINNER  2  . traMADol (ULTRAM) 50 MG tablet Take 1 tablet (50 mg total) by mouth every 6 (six) hours as needed. 30 tablet 0   No current facility-administered medications for this encounter.    Physical Findings: The patient is in no acute distress. Patient is alert and oriented.  weight is 183 lb 1.6 oz (83.054 kg). His blood pressure is 159/71 and his pulse is 73. His respiration is 16 and oxygen saturation  is 100%. .  No significant changes.  Lab Findings: Lab Results  Component Value Date   WBC 6.2 07/21/2015   HGB 13.3 07/21/2015   HCT 39.5 07/21/2015   MCV 87.0 07/21/2015   PLT 152 07/21/2015    Radiographic Findings:  Patient underwent CT imaging in our clinic for post implant dosimetry. The CT appears to demonstrate an adequate distribution of radioactive seeds throughout the prostate gland. There no seeds in her near the rectum. I suspect the final radiation plan and dosimetry will show appropriate coverage of the prostate gland.   Impression: The patient is recovering from the effects of radiation. His urinary symptoms should gradually improve over the next 4-6 months. We talked about this today. He is encouraged by his improvement already and is otherwise please with his outcome.   Plan: Today, I spent time talking to the patient about his prostate seed implant and resolving urinary symptoms. We also talked about long-term follow-up for prostate cancer following seed implant. He understands that ongoing PSA determinations and digital rectal exams will help perform surveillance to rule out disease recurrence. He understands what to expect with his PSA measures. Patient was also educated today about some of the long-term effects from radiation including a small risk for rectal bleeding and possibly erectile dysfunction. We talked about some of the general  management approaches to these potential complications. However, I did encourage the patient to contact our office or return at any point if he has questions or concerns related to his previous radiation and prostate cancer.  _____________________________________  Sheral Apley. Tammi Klippel, M.D.    This document serves as a record of services personally performed by Tyler Pita, MD. It was created on his behalf by Lendon Collar, a trained medical scribe. The creation of this record is based on the scribe's personal observations and the  provider's statements to them. This document has been checked and approved by the attending provider.

## 2015-08-29 DIAGNOSIS — Z510 Encounter for antineoplastic radiation therapy: Secondary | ICD-10-CM | POA: Diagnosis not present

## 2015-09-04 NOTE — Progress Notes (Signed)
  Radiation Oncology         [phone]) [phone] ________________________________  Name: [name] MRN: [id number]  Date: 08/19/2015  DOB: 05-Jan-1941  3D Planning Note   Prostate Brachytherapy Post-Implant Dosimetry  Diagnosis: 75 y.o. gentleman with stage T1c adenocarcinoma of the prostate with a Gleason's score of 3+3 and a PSA of 5.07  Narrative: On a previous date, Marco Brown returned following prostate seed implantation for post implant planning. He underwent CT scan complex simulation to delineate the three-dimensional structures of the pelvis and demonstrate the radiation distribution.  Since that time, the seed localization, and complex isodose planning with dose volume histograms have now been completed.  Results:   Prostate Coverage - The dose of radiation delivered to the 90% or more of the prostate gland (D90) was 97.11% of the prescription dose. This exceeds our goal of greater than 90%. Rectal Sparing - The volume of rectal tissue receiving the prescription dose or higher was 0.0 cc. This falls under our thresholds tolerance of 1.0 cc.  Impression: The prostate seed implant appears to show adequate target coverage and appropriate rectal sparing.  Plan:  The patient will continue to follow with urology for ongoing PSA determinations. I would anticipate a high likelihood for local tumor control with minimal risk for rectal morbidity.  ________________________________  Sheral Apley Tammi Klippel, M.D.

## 2016-01-02 HISTORY — PX: ENDOSCOPIC RELEASE TRANSVERSE CARPAL LIGAMENT OF HAND: SUR444

## 2016-04-18 DIAGNOSIS — H401190 Primary open-angle glaucoma, unspecified eye, stage unspecified: Secondary | ICD-10-CM | POA: Insufficient documentation

## 2016-04-18 DIAGNOSIS — E11641 Type 2 diabetes mellitus with hypoglycemia with coma: Secondary | ICD-10-CM | POA: Insufficient documentation

## 2016-09-10 DIAGNOSIS — I517 Cardiomegaly: Secondary | ICD-10-CM | POA: Insufficient documentation

## 2017-05-07 DIAGNOSIS — E559 Vitamin D deficiency, unspecified: Secondary | ICD-10-CM | POA: Insufficient documentation

## 2017-05-24 IMAGING — DX DG CHEST 2V
2 series · 2 of 2 positions shown · non-contrast
Comparison: None.

CLINICAL DATA: Preop seed implants.  Prostate cancer.  Hypertension

EXAM:
CHEST  2 VIEW

[chest pa]
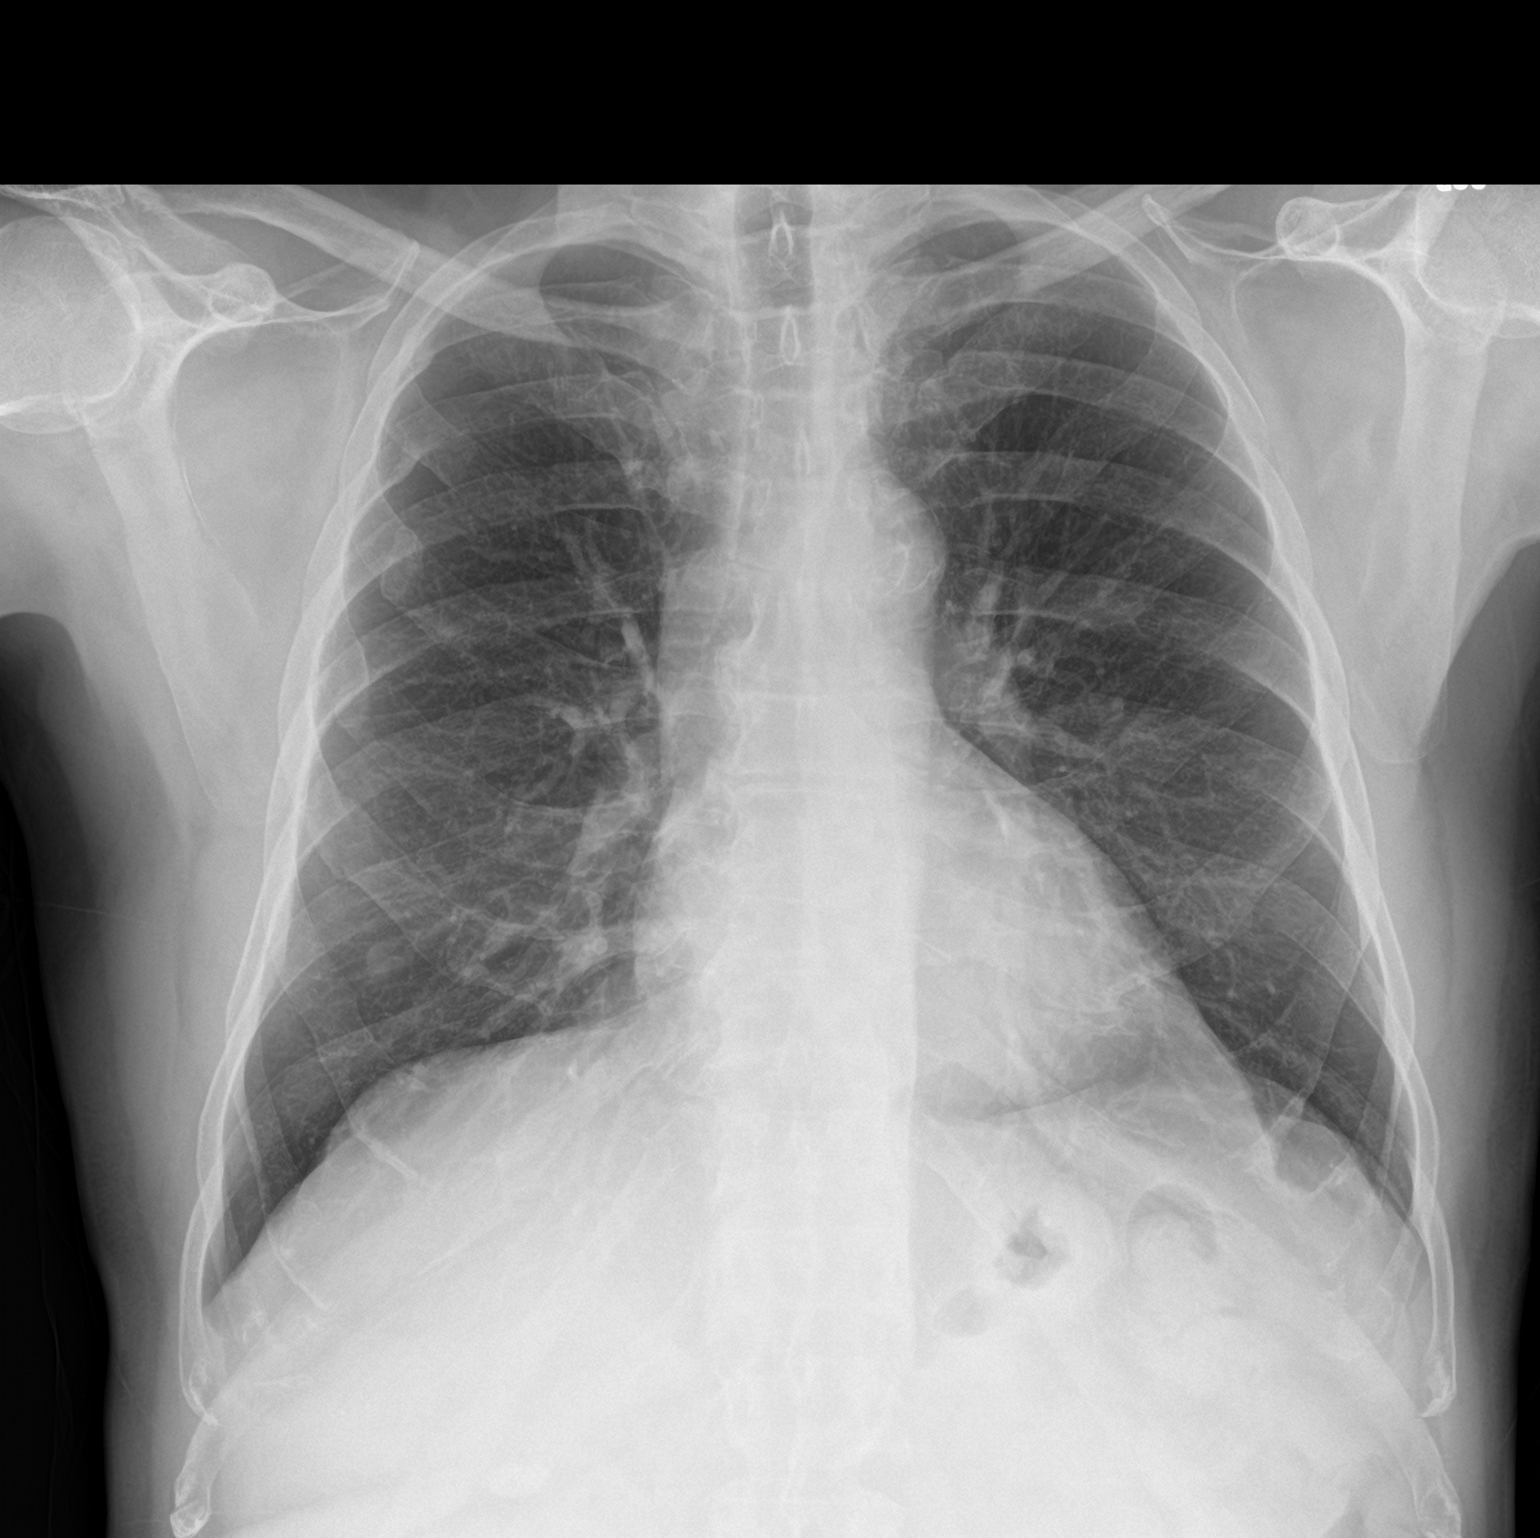

[chest lat]
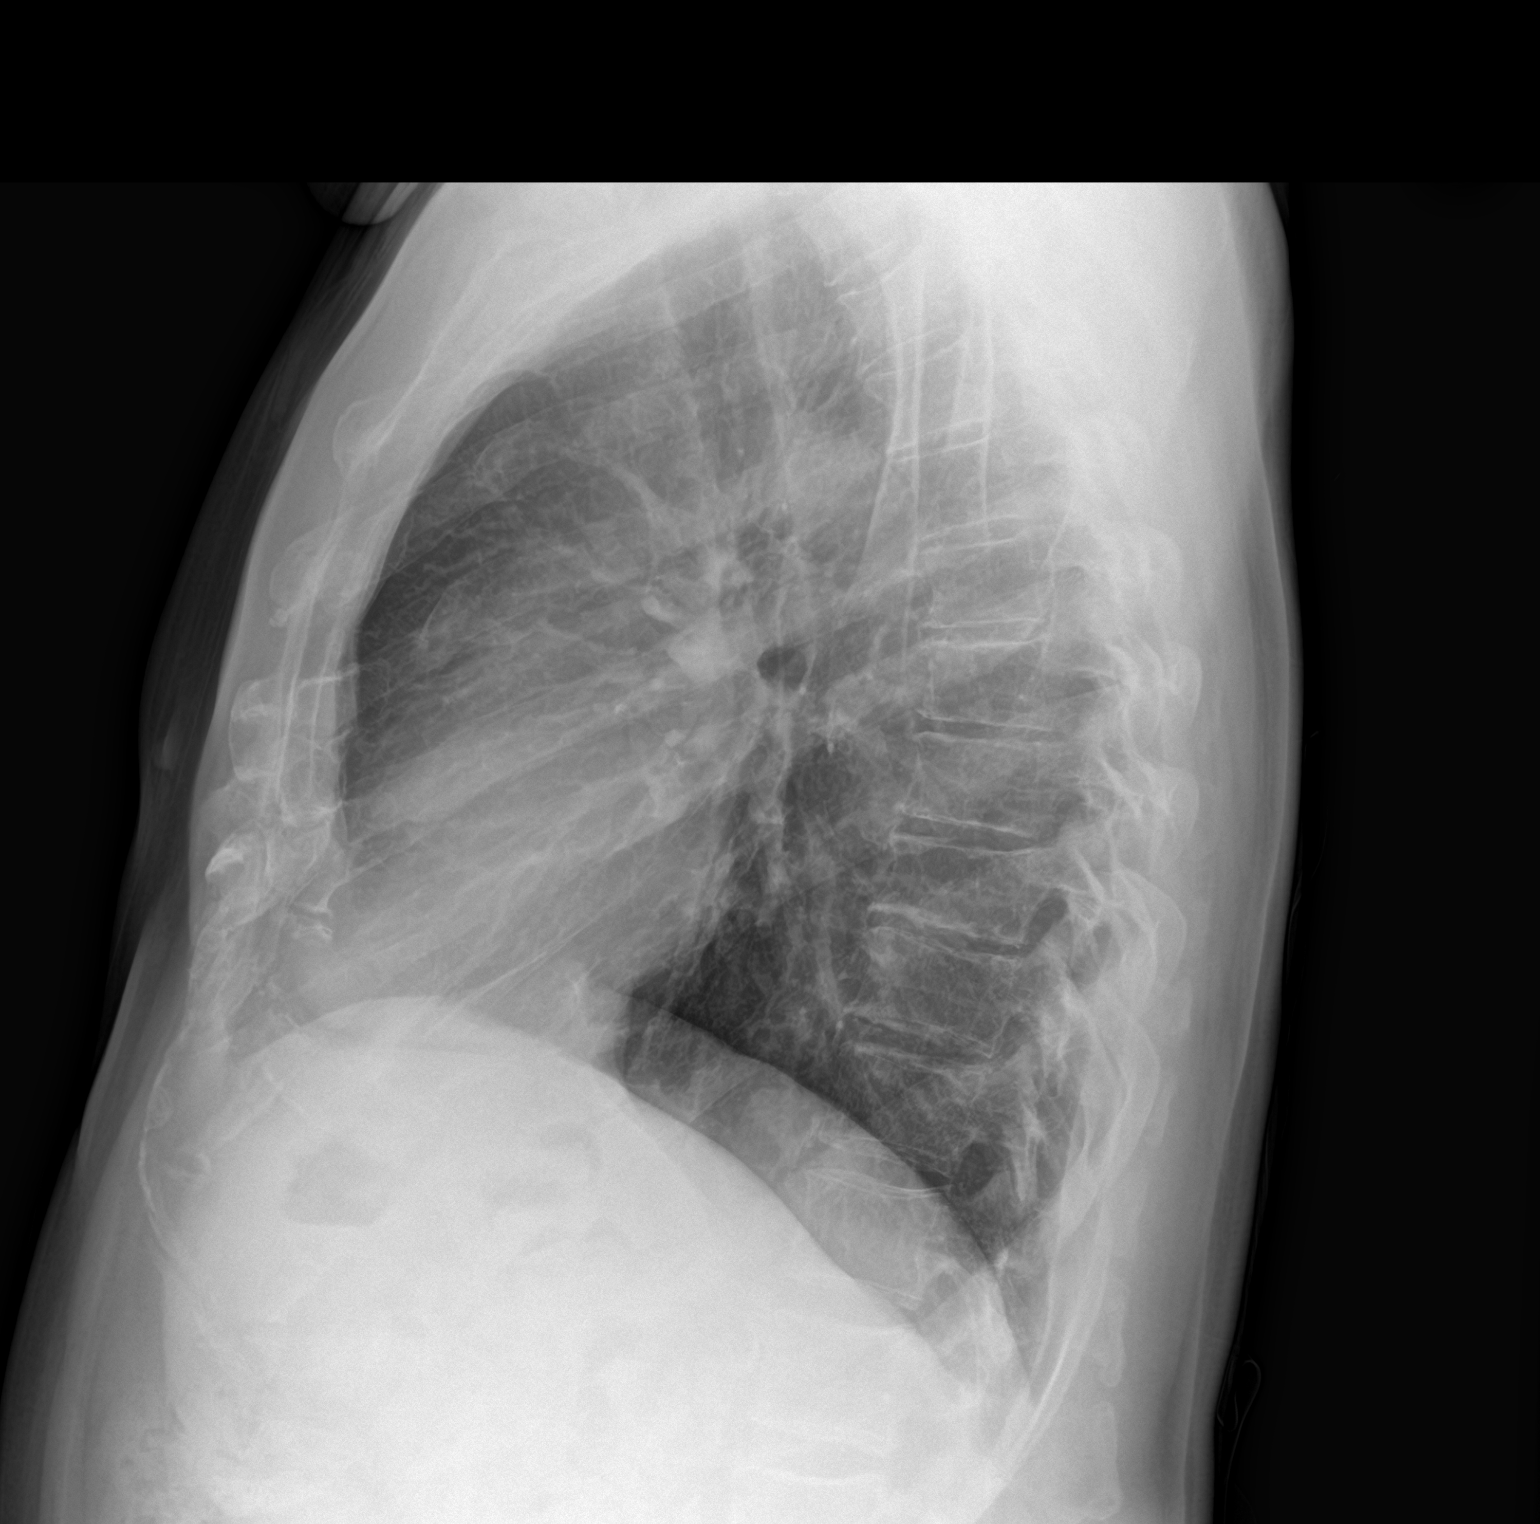

[2 of 2 positions shown; findings below may reference images not displayed]

FINDINGS: Heart and mediastinal contours are within normal limits. No focal
opacities or effusions. No acute bony abnormality. Old right rib
fractures.
IMPRESSION: No active cardiopulmonary disease.

## 2017-11-26 DIAGNOSIS — H2510 Age-related nuclear cataract, unspecified eye: Secondary | ICD-10-CM | POA: Insufficient documentation

## 2018-01-29 DIAGNOSIS — H04123 Dry eye syndrome of bilateral lacrimal glands: Secondary | ICD-10-CM | POA: Insufficient documentation

## 2018-09-03 DIAGNOSIS — H3530 Unspecified macular degeneration: Secondary | ICD-10-CM | POA: Insufficient documentation

## 2018-11-25 DIAGNOSIS — I259 Chronic ischemic heart disease, unspecified: Secondary | ICD-10-CM

## 2018-11-25 HISTORY — DX: Chronic ischemic heart disease, unspecified: I25.9

## 2018-12-02 DIAGNOSIS — Z955 Presence of coronary angioplasty implant and graft: Secondary | ICD-10-CM

## 2018-12-02 HISTORY — DX: Presence of coronary angioplasty implant and graft: Z95.5

## 2018-12-02 HISTORY — PX: CORONARY ANGIOPLASTY WITH STENT PLACEMENT: SHX49

## 2018-12-18 DIAGNOSIS — M21371 Foot drop, right foot: Secondary | ICD-10-CM | POA: Insufficient documentation

## 2019-08-12 DIAGNOSIS — E1122 Type 2 diabetes mellitus with diabetic chronic kidney disease: Secondary | ICD-10-CM | POA: Insufficient documentation

## 2019-09-22 ENCOUNTER — Other Ambulatory Visit: Payer: Self-pay

## 2019-09-22 DIAGNOSIS — C61 Malignant neoplasm of prostate: Secondary | ICD-10-CM

## 2020-03-23 ENCOUNTER — Ambulatory Visit: Payer: 59 | Admitting: Urology

## 2020-05-06 ENCOUNTER — Ambulatory Visit: Payer: 59 | Admitting: Urology

## 2020-05-09 ENCOUNTER — Other Ambulatory Visit: Payer: Self-pay

## 2020-05-09 ENCOUNTER — Encounter: Payer: Self-pay | Admitting: Urology

## 2020-05-09 ENCOUNTER — Ambulatory Visit (INDEPENDENT_AMBULATORY_CARE_PROVIDER_SITE_OTHER): Payer: Medicare PPO | Admitting: Urology

## 2020-05-09 VITALS — BP 111/61 | HR 76 | Temp 98.4°F | Ht 69.5 in | Wt 170.0 lb

## 2020-05-09 DIAGNOSIS — N138 Other obstructive and reflux uropathy: Secondary | ICD-10-CM

## 2020-05-09 DIAGNOSIS — C61 Malignant neoplasm of prostate: Secondary | ICD-10-CM | POA: Diagnosis not present

## 2020-05-09 DIAGNOSIS — N401 Enlarged prostate with lower urinary tract symptoms: Secondary | ICD-10-CM | POA: Diagnosis not present

## 2020-05-09 DIAGNOSIS — N5201 Erectile dysfunction due to arterial insufficiency: Secondary | ICD-10-CM

## 2020-05-09 DIAGNOSIS — R351 Nocturia: Secondary | ICD-10-CM | POA: Diagnosis not present

## 2020-05-09 LAB — URINALYSIS, ROUTINE W REFLEX MICROSCOPIC
Bilirubin, UA: NEGATIVE
Glucose, UA: NEGATIVE
Ketones, UA: NEGATIVE
Leukocytes,UA: NEGATIVE
Nitrite, UA: NEGATIVE
RBC, UA: NEGATIVE
Specific Gravity, UA: 1.02 (ref 1.005–1.030)
Urobilinogen, Ur: 0.2 mg/dL (ref 0.2–1.0)
pH, UA: 5 (ref 5.0–7.5)

## 2020-05-09 LAB — MICROSCOPIC EXAMINATION
Bacteria, UA: NONE SEEN
Epithelial Cells (non renal): NONE SEEN /hpf (ref 0–10)
RBC: NONE SEEN /hpf (ref 0–2)
Renal Epithel, UA: NONE SEEN /hpf
WBC, UA: NONE SEEN /hpf (ref 0–5)

## 2020-05-09 MED ORDER — TAMSULOSIN HCL 0.4 MG PO CAPS: 0.4000 mg | ORAL_CAPSULE | Freq: Every day | ORAL | 11 refills | Status: DC

## 2020-05-09 MED ORDER — TADALAFIL 20 MG PO TABS: 20.0000 mg | ORAL_TABLET | Freq: Every day | ORAL | 5 refills | Status: DC | PRN

## 2020-05-09 NOTE — Progress Notes (Signed)
Urological Symptom Review  Patient is experiencing the following symptoms: Leakage of urine   Review of Systems  Gastrointestinal (upper)  : Negative for upper GI symptoms  Gastrointestinal (lower) : Negative for lower GI symptoms  Constitutional : Negative for symptoms  Skin: Negative for skin symptoms  Eyes: Negative for eye symptoms  Ear/Nose/Throat : Negative for Ear/Nose/Throat symptoms  Hematologic/Lymphatic: Negative for Hematologic/Lymphatic symptoms  Cardiovascular : Chest pain  Respiratory : Shortness of breath  Endocrine: Negative for endocrine symptoms  Musculoskeletal: Back pain Joint pain  Neurological: Dizziness  Psychologic: Negative for psychiatric symptoms
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
## 2020-05-09 NOTE — Addendum Note (Signed)
Addended by: Cleon Gustin on: 05/09/2020 11:56 AM   Modules accepted: Orders

## 2020-05-09 NOTE — Patient Instructions (Signed)
Prostate Cancer  The prostate is a male gland that helps make semen. It is located below a man's bladder, in front of the rectum. Prostate cancer is when abnormal cells grow in this gland. What are the causes? The cause of this condition is not known. What increases the risk? You are more likely to develop this condition if:  You are 79 years of age or older.  You are African American.  You have a family history of prostate cancer.  You have a family history of breast cancer. What are the signs or symptoms? Symptoms of this condition include:  A need to pee often.  Peeing that is weak, or pee that stops and starts.  Trouble starting or stopping your pee.  Inability to pee.  Blood in your pee or semen.  Pain in the lower back, lower belly (abdomen), hips, or upper thighs.  Trouble getting an erection.  Trouble emptying all of your pee. How is this treated? Treatment for this condition depends on your age, your health, the kind of treatment you like, and how far the cancer has spread. Treatments include:  Being watched. This is called observation. You will be tested from time to time, but you will not get treated. Tests are to make sure that the cancer is not growing.  Surgery. This may be done to remove the prostate, to remove the testicles, or to freeze or kill cancer cells.  Radiation. This uses a strong beam to kill cancer cells.  Ultrasound energy. This uses strong sound waves to kill cancer cells.  Chemotherapy. This uses medicines that stop cancer cells from increasing. This kills cancer cells and healthy cells.  Targeted therapy. This kills cancer cells only. Healthy cells are not affected.  Hormone treatment. This stops the body from making hormones that help the cancer cells to grow. Follow these instructions at home:  Take over-the-counter and prescription medicines only as told by your doctor.  Eat a healthy diet.  Get plenty of sleep.  Ask your  doctor for help to find a support group for men with prostate cancer.  If you have to go to the hospital, let your cancer doctor (oncologist) know.  Treatment may affect your ability to have sex. Touch, hold, hug, and caress your partner to have intimate moments.  Keep all follow-up visits as told by your doctor. This is important. Contact a doctor if:  You have new or more trouble peeing.  You have new or more blood in your pee.  You have new or more pain in your hips, back, or chest. Get help right away if:  You have weakness in your legs.  You lose feeling in your legs.  You cannot control your pee or your poop (stool).  You have chills or a fever. Summary  The prostate is a male gland that helps make semen.  Prostate cancer is when abnormal cells grow in this gland.  Treatment includes doing surgery, using medicines, using very strong beams, or watching without treatment.  Ask your doctor for help to find a support group for men with prostate cancer.  Contact a doctor if you have problems peeing or have any new pain that you did not have before. This information is not intended to replace advice given to you by your health care provider. Make sure you discuss any questions you have with your health care provider. Document Revised: 02/24/2019 Document Reviewed: 02/24/2019 Elsevier Patient Education  2021 Elsevier Inc.  

## 2020-06-14 DIAGNOSIS — I129 Hypertensive chronic kidney disease with stage 1 through stage 4 chronic kidney disease, or unspecified chronic kidney disease: Secondary | ICD-10-CM | POA: Insufficient documentation

## 2020-10-18 ENCOUNTER — Other Ambulatory Visit: Payer: Self-pay | Admitting: Urology

## 2021-03-02 ENCOUNTER — Other Ambulatory Visit: Payer: Self-pay

## 2021-05-10 ENCOUNTER — Ambulatory Visit: Payer: 59 | Admitting: Urology

## 2021-05-23 ENCOUNTER — Other Ambulatory Visit: Payer: Self-pay

## 2021-05-23 ENCOUNTER — Ambulatory Visit (INDEPENDENT_AMBULATORY_CARE_PROVIDER_SITE_OTHER): Payer: Medicare PPO | Admitting: Urology

## 2021-05-23 ENCOUNTER — Encounter: Payer: Self-pay | Admitting: Urology

## 2021-05-23 VITALS — BP 119/69 | HR 71

## 2021-05-23 DIAGNOSIS — C61 Malignant neoplasm of prostate: Secondary | ICD-10-CM

## 2021-05-23 DIAGNOSIS — N5201 Erectile dysfunction due to arterial insufficiency: Secondary | ICD-10-CM | POA: Diagnosis not present

## 2021-05-23 DIAGNOSIS — N401 Enlarged prostate with lower urinary tract symptoms: Secondary | ICD-10-CM | POA: Diagnosis not present

## 2021-05-23 DIAGNOSIS — R351 Nocturia: Secondary | ICD-10-CM

## 2021-05-23 DIAGNOSIS — N138 Other obstructive and reflux uropathy: Secondary | ICD-10-CM

## 2021-05-23 LAB — URINALYSIS, ROUTINE W REFLEX MICROSCOPIC
Bilirubin, UA: NEGATIVE
Glucose, UA: NEGATIVE
Ketones, UA: NEGATIVE
Leukocytes,UA: NEGATIVE
Nitrite, UA: NEGATIVE
Protein,UA: NEGATIVE
RBC, UA: NEGATIVE
Specific Gravity, UA: 1.015 (ref 1.005–1.030)
Urobilinogen, Ur: 0.2 mg/dL (ref 0.2–1.0)
pH, UA: 6 (ref 5.0–7.5)

## 2021-05-23 MED ORDER — TADALAFIL 20 MG PO TABS: 20.0000 mg | ORAL_TABLET | Freq: Every day | ORAL | 5 refills | Status: AC | PRN

## 2021-05-23 MED ORDER — TAMSULOSIN HCL 0.4 MG PO CAPS: 0.4000 mg | ORAL_CAPSULE | Freq: Every day | ORAL | 3 refills | Status: DC
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
## 2021-05-23 NOTE — Patient Instructions (Signed)
Prostate Cancer °The prostate is a small gland that helps make semen. It is located below a man's bladder, in front of the rectum. Prostate cancer is when abnormal cells grow in this gland. °What are the causes? °The cause of this condition is not known. °What increases the risk? °Being age 80 or older. °Having a family history of prostate cancer. °Having a family history of cancer of the breasts or ovaries. °Having genes that are passed from parent to child (inherited). °Having Lynch syndrome. °African American men and men of African descent are diagnosed with prostate cancer at higher rates than other men. °What are the signs or symptoms? °Problems peeing (urinating). This may include: °A stream that is weak, or pee that stops and starts. °Trouble starting or stopping your pee. °Trouble emptying all of your pee. °Needing to pee more often, especially at night. °Blood in your pee or semen. °Pain in the: °Lower back. °Lower belly (abdomen). °Hips. °Trouble getting an erection. °Weakness or numbness in the legs or feet. °How is this treated? °Treatment for this condition depends on: °How much the cancer has spread. °Your age. °The kind of treatment you want. °Your health. °Treatments include: °Being watched. This is called observation. You will be tested from time to time, but you will not get treated. Tests are to make sure that the cancer is not growing. °Surgery. This may be done to: °Take out (remove) the prostate. °Freeze and kill cancer cells. °Radiation. This uses a strong beam of energy to kill cancer cells. °Chemotherapy. This uses medicines that stop cancer cells from increasing. This kills cancer cells and healthy cells. °Targeted therapy. This kills cancer cells only. Healthy cells are not affected. °Hormone treatment. This stops the body from making hormones that help the cancer cells grow. °Follow these instructions at home: °Lifestyle °Do not smoke or use any products that contain nicotine or tobacco.  If you need help quitting, ask your doctor. °Eat a healthy diet. °Treatment may affect your ability to have sex. If you have a partner, touch, hold, hug, and caress your partner to have intimate moments. °Get plenty of sleep. °Ask your doctor for help to find a support group for men with prostate cancer. °General instructions °Take over-the-counter and prescription medicines only as told by your doctor. °If you have to go to the hospital, let your cancer doctor (oncologist) know. °Keep all follow-up visits. °Where to find more information °American Cancer Society: www.cancer.org °American Society of Clinical Oncology: www.cancer.net °National Cancer Institute: www.cancer.gov °Contact a doctor if: °You have new or more trouble peeing. °You have new or more blood in your pee. °You have new or more pain in your hips, back, or chest. °Get help right away if: °You have weakness in your legs. °You lose feeling in your legs. °You cannot control your pee or your poop (stool). °You have chills or a fever. °Summary °The prostate is a male gland that helps make semen. °Prostate cancer is when abnormal cells grow in this gland. °Treatment includes doing surgery, using medicines, using strong beams of energy, or watching without treatment. °Ask your doctor for help to find a support group for men with prostate cancer. °Contact a doctor if you have problems peeing or have any new pain that you did not have before. °This information is not intended to replace advice given to you by your health care provider. Make sure you discuss any questions you have with your health care provider. °Document Revised: 06/08/2020 Document Reviewed: 06/08/2020 °Elsevier   Patient Education © 2022 Elsevier Inc. ° °

## 2021-09-20 ENCOUNTER — Other Ambulatory Visit: Payer: Self-pay

## 2022-04-10 ENCOUNTER — Telehealth: Payer: Self-pay

## 2022-04-10 NOTE — Telephone Encounter (Signed)
Made patient aware to keep his F/U appt with MD, Patient voiced understanding

## 2022-04-10 NOTE — Telephone Encounter (Signed)
Wife call today and voiced that patient had PSA draw through PCP in 12/23 but wanted to know if the patient need to come see Dr. Alyson Ingles sooner due to patient PSA levels have increased. Made wife aware that I will send Dr. Alyson Ingles a task and once he response someone will be in touch. Wife voiced understanding.

## 2022-05-23 ENCOUNTER — Ambulatory Visit: Payer: Medicare PPO | Admitting: Urology

## 2022-05-23 ENCOUNTER — Encounter: Payer: Self-pay | Admitting: Urology

## 2022-05-23 VITALS — BP 136/75 | HR 71

## 2022-05-23 DIAGNOSIS — N401 Enlarged prostate with lower urinary tract symptoms: Secondary | ICD-10-CM | POA: Diagnosis not present

## 2022-05-23 DIAGNOSIS — N5201 Erectile dysfunction due to arterial insufficiency: Secondary | ICD-10-CM

## 2022-05-23 DIAGNOSIS — R351 Nocturia: Secondary | ICD-10-CM

## 2022-05-23 DIAGNOSIS — C61 Malignant neoplasm of prostate: Secondary | ICD-10-CM

## 2022-05-23 DIAGNOSIS — N138 Other obstructive and reflux uropathy: Secondary | ICD-10-CM | POA: Diagnosis not present

## 2022-05-23 MED ORDER — TAMSULOSIN HCL 0.4 MG PO CAPS: 0.4000 mg | ORAL_CAPSULE | Freq: Every day | ORAL | 3 refills | Status: DC
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
## 2022-05-23 NOTE — Patient Instructions (Signed)

## 2022-05-24 LAB — MICROSCOPIC EXAMINATION: Bacteria, UA: NONE SEEN

## 2022-05-24 LAB — URINALYSIS, ROUTINE W REFLEX MICROSCOPIC
Bilirubin, UA: NEGATIVE
Leukocytes,UA: NEGATIVE
Nitrite, UA: NEGATIVE
RBC, UA: NEGATIVE
Specific Gravity, UA: 1.015 (ref 1.005–1.030)
Urobilinogen, Ur: 0.2 mg/dL (ref 0.2–1.0)
pH, UA: 5.5 (ref 5.0–7.5)

## 2022-05-24 LAB — PSA: Prostate Specific Ag, Serum: 3.5 ng/mL (ref 0.0–4.0)

## 2022-05-29 ENCOUNTER — Other Ambulatory Visit: Payer: Self-pay | Admitting: Urology

## 2022-05-29 DIAGNOSIS — C61 Malignant neoplasm of prostate: Secondary | ICD-10-CM

## 2022-06-21 ENCOUNTER — Encounter (HOSPITAL_COMMUNITY)
Admission: RE | Admit: 2022-06-21 | Discharge: 2022-06-21 | Disposition: A | Discharge status: Home / Self Care | Payer: Medicare PPO | Source: Ambulatory Visit | Attending: Urology | Admitting: Urology

## 2022-06-21 DIAGNOSIS — C61 Malignant neoplasm of prostate: Secondary | ICD-10-CM | POA: Diagnosis present

## 2022-06-21 MED ORDER — PIFLIFOLASTAT F 18 (PYLARIFY) INJECTION
9.0000 | Freq: Once | INTRAVENOUS | Status: AC
  Administered 2022-06-21: 9.12 via INTRAVENOUS

## 2022-07-02 ENCOUNTER — Other Ambulatory Visit: Payer: Self-pay | Admitting: Urology

## 2022-07-04 ENCOUNTER — Ambulatory Visit: Payer: Medicare PPO | Admitting: Urology

## 2022-07-04 VITALS — BP 131/64 | HR 61

## 2022-07-04 DIAGNOSIS — N4289 Other specified disorders of prostate: Secondary | ICD-10-CM | POA: Diagnosis not present

## 2022-07-04 DIAGNOSIS — R972 Elevated prostate specific antigen [PSA]: Secondary | ICD-10-CM

## 2022-07-04 DIAGNOSIS — C61 Malignant neoplasm of prostate: Secondary | ICD-10-CM

## 2022-07-04 LAB — URINALYSIS, ROUTINE W REFLEX MICROSCOPIC
Bilirubin, UA: NEGATIVE
Glucose, UA: NEGATIVE
Ketones, UA: NEGATIVE
Leukocytes,UA: NEGATIVE
Nitrite, UA: NEGATIVE
RBC, UA: NEGATIVE
Specific Gravity, UA: 1.015 (ref 1.005–1.030)
Urobilinogen, Ur: 0.2 mg/dL (ref 0.2–1.0)
pH, UA: 7 (ref 5.0–7.5)

## 2022-07-04 MED ORDER — LEVOFLOXACIN 750 MG PO TABS: 750.0000 mg | ORAL_TABLET | Freq: Every day | ORAL | 0 refills | Status: DC

## 2022-07-04 NOTE — Patient Instructions (Signed)
   Appointment Time: 12:15p Appointment Date: 07/18/2022  Location: [facility]   Prostate Biopsy Instructions  Stop all aspirin or blood thinners (aspirin, plavix, coumadin, warfarin, motrin, ibuprofen, advil, aleve, naproxen, naprosyn) for 7 days prior to the procedure.  If you have any questions about stopping these medications, please contact your primary care physician or cardiologist.  Having a light meal prior to the procedure is recommended.  If you are diabetic or have low blood sugar please bring a small snack or glucose tablet.  A Fleets enema is needed to be purchased over the counter at a local pharmacy and used 2 hours before you scheduled appointment.  This can be purchased over the counter at any pharmacy.  Antibiotics will be administered in the clinic at the time of the procedure and 1 tablet has been sent to your pharmacy. Please take the antibiotic as prescribed.    Please bring someone with you to the procedure to drive you home if you are given a valium to take prior to your procedure.   If you have any questions or concerns, please feel free to call the office at [phone] or send a Mychart message.    Thank you, [facility]
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
## 2022-07-05 ENCOUNTER — Encounter: Payer: Self-pay | Admitting: Urology

## 2022-07-18 ENCOUNTER — Ambulatory Visit (HOSPITAL_COMMUNITY)
Admission: RE | Admit: 2022-07-18 | Discharge: 2022-07-18 | Disposition: A | Discharge status: Home / Self Care | Payer: Medicare PPO | Source: Ambulatory Visit | Attending: Urology | Admitting: Urology

## 2022-07-18 ENCOUNTER — Other Ambulatory Visit: Payer: Self-pay | Admitting: Urology

## 2022-07-18 ENCOUNTER — Ambulatory Visit (HOSPITAL_BASED_OUTPATIENT_CLINIC_OR_DEPARTMENT_OTHER): Payer: Medicare PPO | Admitting: Urology

## 2022-07-18 ENCOUNTER — Encounter (HOSPITAL_COMMUNITY): Payer: Self-pay

## 2022-07-18 VITALS — BP 157/80 | HR 75 | Temp 98.1°F | Resp 18

## 2022-07-18 DIAGNOSIS — C61 Malignant neoplasm of prostate: Secondary | ICD-10-CM | POA: Diagnosis not present

## 2022-07-18 MED ORDER — LIDOCAINE HCL (PF) 2 % IJ SOLN
INTRAMUSCULAR | Status: AC
  Filled 2022-07-18: qty 20

## 2022-07-18 MED ORDER — GENTAMICIN SULFATE 40 MG/ML IJ SOLN
INTRAMUSCULAR | Status: AC
  Filled 2022-07-18: qty 2

## 2022-07-18 MED ORDER — GENTAMICIN SULFATE 40 MG/ML IJ SOLN
80.0000 mg | Freq: Once | INTRAMUSCULAR | Status: AC
  Administered 2022-07-18: 80 mg via INTRAMUSCULAR

## 2022-07-18 MED ORDER — LIDOCAINE HCL (PF) 2 % IJ SOLN
10.0000 mL | Freq: Once | INTRAMUSCULAR | Status: AC
  Administered 2022-07-18: 10 mL

## 2022-07-18 NOTE — Progress Notes (Signed)
Prostate Biopsy Procedure   Informed consent was obtained after discussing risks/benefits of the procedure.  A time out was performed to ensure correct patient identity.  Pre-Procedure: - Last PSA Level: No results found for: "PSA" - Gentamicin given prophylactically - Levaquin 500 mg administered PO -Transrectal Ultrasound performed revealing a 24.9gm prostate -No significant hypoechoic or median lobe noted  Procedure: - Prostate block performed using 10 cc 1% lidocaine and biopsies taken from sextant areas, a total of 6 under ultrasound guidance.  Post-Procedure: - Patient tolerated the procedure well - He was counseled to seek immediate medical attention if experiences any severe pain, significant bleeding, or fevers - Return in one week to discuss biopsy results

## 2022-07-18 NOTE — Progress Notes (Signed)
PT tolerated right sided prostate biopsy procedure well today. Labs obtained and sent for pathology by Cainen from ultrasound at 1309. PT ambulatory at discharge with no acute distress noted and verbalized understanding of discharge instructions. PT to follow up with urologist as scheduled on 08/03/22.

## 2022-07-20 ENCOUNTER — Encounter: Payer: Self-pay | Admitting: Urology

## 2022-07-20 NOTE — Patient Instructions (Signed)
Transrectal Ultrasound-Guided Prostate Biopsy, Care After The following information offers guidance on how to care for yourself after your procedure. Your health care provider may also give you more specific instructions. If you have problems or questions, contact your health care provider. What can I expect after the procedure? After the procedure, it is common to have: Pain and discomfort near your rectum, especially while sitting. Pink-colored urine due to small amounts of blood in your urine. A burning feeling while urinating. Blood in your stool (feces) or bleeding from your rectum. Blood in your semen. Follow these instructions at home: Medicines Take over-the-counter and prescription medicines only as told by your health care provider. If you were given a sedative during your procedure, it can affect you for several hours. Do not drive or operate machinery until your health care provider says that it is safe. If you were prescribed an antibiotic medicine, take it as told by your health care provider. Do not stop using the antibiotic even if you start to feel better. Activity  Return to your normal activities as told by your health care provider. Ask your health care provider what activities are safe for you. Ask your health care provider when it is okay for you to resume sexual activity. You may have to avoid lifting. Ask your health care provider how much you can safely lift. General instructions  Drink enough fluid to keep your urine pale yellow. Watch your urine, stool, and semen for new or increased bleeding. Keep all follow-up visits. This is important. Contact a health care provider if: You have any of the following: Blood clots in your urine or stool. Blood in your urine more than 2 weeks after the procedure. Blood in your semen more than 2 months after the procedure. New or increased bleeding in your urine, stool, or semen. Severe pain in your abdomen. Your urine smells  bad or unusual. You have trouble urinating. Your lower abdomen feels firm. You have problems getting an erection. You have nausea or you vomit. Get help right away if: You have a fever or chills. This could be a sign of infection. You have bright red urine. You have severe pain that does not get better with medicine. You cannot urinate. Summary After this procedure, it is common to have pain and discomfort around your rectum, especially while sitting. You may have blood in your urine and stool after the procedure. It is common to have blood in your semen after this procedure. Get help right away if you have a fever or chills. This could be a sign of infection. This information is not intended to replace advice given to you by your health care provider. Make sure you discuss any questions you have with your health care provider. Document Revised: 09/05/2020 Document Reviewed: 09/05/2020 Elsevier Patient Education  2023 Elsevier Inc.  

## 2022-08-03 ENCOUNTER — Ambulatory Visit: Payer: Medicare PPO | Admitting: Urology

## 2022-08-08 ENCOUNTER — Ambulatory Visit (INDEPENDENT_AMBULATORY_CARE_PROVIDER_SITE_OTHER): Payer: Medicare PPO | Admitting: Urology

## 2022-08-08 ENCOUNTER — Encounter: Payer: Self-pay | Admitting: Urology

## 2022-08-08 VITALS — BP 113/67 | HR 66

## 2022-08-08 DIAGNOSIS — C61 Malignant neoplasm of prostate: Secondary | ICD-10-CM | POA: Diagnosis not present
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
## 2022-08-09 ENCOUNTER — Encounter: Payer: Self-pay | Admitting: Urology

## 2022-08-09 NOTE — Patient Instructions (Signed)

## 2022-08-15 ENCOUNTER — Telehealth: Payer: Self-pay

## 2022-08-15 NOTE — Telephone Encounter (Signed)
The patient's wife called advising that he hospital has not contacted them to schedule the surgery and wanted to follow up to confirm the next steps. She advised she could be contacted at  [phone]      Thank you

## 2022-08-15 NOTE — Telephone Encounter (Signed)
Patient aware that we will reach out next week with this information.  Estimated surgery date was end of June, beginning of July.  Patient voiced understanding.

## 2022-08-27 NOTE — Telephone Encounter (Signed)
Please see below.  Can we remove [employer] from demographics per patient request.

## 2022-09-04 ENCOUNTER — Encounter (HOSPITAL_BASED_OUTPATIENT_CLINIC_OR_DEPARTMENT_OTHER): Payer: Self-pay | Admitting: Urology

## 2022-09-04 NOTE — Progress Notes (Signed)
Spoke w/ via phone for pre-op interview--- pt Lab needs dos---- [employer] results------ have requested 12 lead EKG from pt's cardiologist to be faxed  COVID test -----patient states asymptomatic no test needed Arrive at ------- 1045 on 09-20-2022 NPO after MN NO Solid Food.  Clear liquids from MN until--- 0945 Med rec completed Medications to take morning of surgery ----- imdur, ranexa, coreg, synthroid Diabetic medication ----- do not take amaryl morning of surgery Patient instructed no nail polish to be worn day of surgery Patient instructed to bring photo id and insurance card day of surgery Patient aware to have Driver (ride ) / caregiver    for 24 hours after surgery -- wife, gail Patient Special Instructions -----  n/a Pre-Op special Instructions -----  n/a Patient verbalized understanding of instructions that were given at this phone interview. Patient denies shortness of breath, chest pain, fever, cough at this phone interview.  Anesthesia Review:  HTN;  CAD s/p PCI with stenting x3 ;  chronic angina;  doe; DM 2;  CKD 3;   Pt stated currently no chest pain.    PCP:  Dr Mart Piggs Cardiologist : Dr Algis Downs. Kathryne Sharper Chest x-ray :  EKG :  requested last 12 lead EKG from cardiologist Echo : 08-22-2016  (CE) Stress test:  07-08-2018 (CE) Cardiac Cath :  yes ( per pt either 2021 or 2022) Activity level:  states sob w/ inclines, stairs ok and some housework ok Sleep Study/ CPAP : yes/ no Fasting Blood Sugar :  120--150    / Checks Blood Sugar -- times a day:  daily am Blood Thinner/ Instructions /Last Dose: no ASA / Instructions/ Last Dose :  per dr Ronne Binning pt may continue asa

## 2022-09-05 ENCOUNTER — Encounter (HOSPITAL_BASED_OUTPATIENT_CLINIC_OR_DEPARTMENT_OTHER): Payer: Self-pay | Admitting: Urology

## 2022-09-20 ENCOUNTER — Ambulatory Visit (HOSPITAL_BASED_OUTPATIENT_CLINIC_OR_DEPARTMENT_OTHER)
Admission: RE | Admit: 2022-09-20 | Discharge: 2022-09-20 | Disposition: A | Discharge status: Home / Self Care | Payer: Medicare PPO | Attending: Urology | Admitting: Urology

## 2022-09-20 ENCOUNTER — Encounter (HOSPITAL_BASED_OUTPATIENT_CLINIC_OR_DEPARTMENT_OTHER): Payer: Self-pay | Admitting: Urology

## 2022-09-20 ENCOUNTER — Ambulatory Visit (HOSPITAL_BASED_OUTPATIENT_CLINIC_OR_DEPARTMENT_OTHER): Payer: Medicare PPO | Admitting: Anesthesiology

## 2022-09-20 ENCOUNTER — Encounter (HOSPITAL_BASED_OUTPATIENT_CLINIC_OR_DEPARTMENT_OTHER)
Admission: RE | Disposition: A | Discharge status: Home / Self Care | Payer: Self-pay | Source: Home / Self Care | Attending: Urology

## 2022-09-20 ENCOUNTER — Other Ambulatory Visit: Payer: Self-pay

## 2022-09-20 DIAGNOSIS — I2510 Atherosclerotic heart disease of native coronary artery without angina pectoris: Secondary | ICD-10-CM | POA: Diagnosis not present

## 2022-09-20 DIAGNOSIS — I10 Essential (primary) hypertension: Secondary | ICD-10-CM

## 2022-09-20 DIAGNOSIS — Z87891 Personal history of nicotine dependence: Secondary | ICD-10-CM | POA: Diagnosis not present

## 2022-09-20 DIAGNOSIS — E119 Type 2 diabetes mellitus without complications: Secondary | ICD-10-CM | POA: Insufficient documentation

## 2022-09-20 DIAGNOSIS — G4730 Sleep apnea, unspecified: Secondary | ICD-10-CM | POA: Insufficient documentation

## 2022-09-20 DIAGNOSIS — N529 Male erectile dysfunction, unspecified: Secondary | ICD-10-CM | POA: Insufficient documentation

## 2022-09-20 DIAGNOSIS — C61 Malignant neoplasm of prostate: Secondary | ICD-10-CM | POA: Insufficient documentation

## 2022-09-20 DIAGNOSIS — M1990 Unspecified osteoarthritis, unspecified site: Secondary | ICD-10-CM | POA: Insufficient documentation

## 2022-09-20 DIAGNOSIS — Z7984 Long term (current) use of oral hypoglycemic drugs: Secondary | ICD-10-CM | POA: Diagnosis not present

## 2022-09-20 DIAGNOSIS — Z955 Presence of coronary angioplasty implant and graft: Secondary | ICD-10-CM | POA: Insufficient documentation

## 2022-09-20 DIAGNOSIS — Z01818 Encounter for other preprocedural examination: Secondary | ICD-10-CM

## 2022-09-20 DIAGNOSIS — Z79899 Other long term (current) drug therapy: Secondary | ICD-10-CM | POA: Insufficient documentation

## 2022-09-20 HISTORY — DX: Presence of coronary angioplasty implant and graft: Z95.5

## 2022-09-20 HISTORY — DX: Atherosclerotic heart disease of native coronary artery without angina pectoris: I25.10

## 2022-09-20 HISTORY — DX: Gastro-esophageal reflux disease without esophagitis: K21.9

## 2022-09-20 HISTORY — DX: Other fatigue: R53.83

## 2022-09-20 HISTORY — DX: Foot drop, right foot: M21.371

## 2022-09-20 HISTORY — DX: Unspecified osteoarthritis, unspecified site: M19.90

## 2022-09-20 HISTORY — DX: Unspecified glaucoma: H40.9

## 2022-09-20 HISTORY — DX: Malignant neoplasm of prostate: C61

## 2022-09-20 HISTORY — DX: Other forms of dyspnea: R06.09

## 2022-09-20 HISTORY — DX: Hypothyroidism, unspecified: E03.9

## 2022-09-20 HISTORY — DX: Trifascicular block: I45.3

## 2022-09-20 HISTORY — DX: Type 2 diabetes mellitus without complications: E11.9

## 2022-09-20 HISTORY — DX: Other chronic pain: G89.29

## 2022-09-20 HISTORY — DX: Other forms of angina pectoris: I20.89

## 2022-09-20 HISTORY — DX: Hyperlipidemia, unspecified: E78.5

## 2022-09-20 HISTORY — DX: Benign prostatic hyperplasia with lower urinary tract symptoms: N40.1

## 2022-09-20 HISTORY — DX: Chronic kidney disease, stage 3 unspecified: N18.30

## 2022-09-20 HISTORY — PX: CRYOABLATION: SHX1415

## 2022-09-20 HISTORY — DX: Unspecified hearing loss, unspecified ear: H91.90

## 2022-09-20 HISTORY — DX: Rising PSA following treatment for malignant neoplasm of prostate: R97.21

## 2022-09-20 HISTORY — DX: Male erectile dysfunction, unspecified: N52.9

## 2022-09-20 LAB — POCT I-STAT, CHEM 8
BUN: 15 mg/dL (ref 8–23)
Calcium, Ion: 1.2 mmol/L (ref 1.15–1.40)
Chloride: 102 mmol/L (ref 98–111)
Creatinine, Ser: 1.3 mg/dL — ABNORMAL HIGH (ref 0.61–1.24)
Glucose, Bld: 168 mg/dL — ABNORMAL HIGH (ref 70–99)
HCT: 37 % — ABNORMAL LOW (ref 39.0–52.0)
Hemoglobin: 12.6 g/dL — ABNORMAL LOW (ref 13.0–17.0)
Potassium: 4.3 mmol/L (ref 3.5–5.1)
Sodium: 136 mmol/L (ref 135–145)
TCO2: 24 mmol/L (ref 22–32)

## 2022-09-20 LAB — GLUCOSE, CAPILLARY: Glucose-Capillary: 110 mg/dL — ABNORMAL HIGH (ref 70–99)

## 2022-09-20 SURGERY — CRYOABLATION, PROSTATE
Anesthesia: General | Site: Prostate

## 2022-09-20 MED ORDER — SODIUM CHLORIDE 0.9 % IV SOLN: INTRAVENOUS | Status: DC

## 2022-09-20 MED ORDER — CEFAZOLIN SODIUM-DEXTROSE 2-4 GM/100ML-% IV SOLN
2.0000 g | INTRAVENOUS | Status: AC
  Administered 2022-09-20: 2 g via INTRAVENOUS

## 2022-09-20 MED ORDER — PHENYLEPHRINE HCL-NACL 20-0.9 MG/250ML-% IV SOLN
INTRAVENOUS | Status: DC | PRN
  Administered 2022-09-20: 25 ug/min via INTRAVENOUS

## 2022-09-20 MED ORDER — SODIUM CHLORIDE 0.9 % IR SOLN
Status: DC | PRN
  Administered 2022-09-20: 200 mL

## 2022-09-20 MED ORDER — SUGAMMADEX SODIUM 200 MG/2ML IV SOLN
INTRAVENOUS | Status: DC | PRN
  Administered 2022-09-20: 350 mg via INTRAVENOUS

## 2022-09-20 MED ORDER — TRAMADOL HCL 50 MG PO TABS: 50.0000 mg | ORAL_TABLET | Freq: Four times a day (QID) | ORAL | 0 refills | Status: AC | PRN

## 2022-09-20 MED ORDER — PROPOFOL 10 MG/ML IV BOLUS
INTRAVENOUS | Status: AC
  Filled 2022-09-20: qty 20

## 2022-09-20 MED ORDER — STERILE WATER FOR IRRIGATION IR SOLN
Status: DC | PRN
  Administered 2022-09-20: 500 mL

## 2022-09-20 MED ORDER — FENTANYL CITRATE (PF) 100 MCG/2ML IJ SOLN
INTRAMUSCULAR | Status: AC
  Filled 2022-09-20: qty 2

## 2022-09-20 MED ORDER — PHENYLEPHRINE HCL (PRESSORS) 10 MG/ML IV SOLN
INTRAVENOUS | Status: AC
  Filled 2022-09-20: qty 1

## 2022-09-20 MED ORDER — CEFAZOLIN SODIUM-DEXTROSE 2-4 GM/100ML-% IV SOLN
INTRAVENOUS | Status: AC
  Filled 2022-09-20: qty 100

## 2022-09-20 MED ORDER — EPHEDRINE SULFATE-NACL 50-0.9 MG/10ML-% IV SOSY
PREFILLED_SYRINGE | INTRAVENOUS | Status: DC | PRN
  Administered 2022-09-20 (×4): 5 mg via INTRAVENOUS

## 2022-09-20 MED ORDER — ROCURONIUM BROMIDE 10 MG/ML (PF) SYRINGE
PREFILLED_SYRINGE | INTRAVENOUS | Status: DC | PRN
  Administered 2022-09-20: 60 mg via INTRAVENOUS

## 2022-09-20 MED ORDER — PROPOFOL 10 MG/ML IV BOLUS
INTRAVENOUS | Status: DC | PRN
  Administered 2022-09-20: 130 mg via INTRAVENOUS

## 2022-09-20 MED ORDER — PHENYLEPHRINE 80 MCG/ML (10ML) SYRINGE FOR IV PUSH (FOR BLOOD PRESSURE SUPPORT)
PREFILLED_SYRINGE | INTRAVENOUS | Status: DC | PRN
  Administered 2022-09-20 (×3): 80 ug via INTRAVENOUS

## 2022-09-20 MED ORDER — ONDANSETRON HCL 4 MG/2ML IJ SOLN
INTRAMUSCULAR | Status: DC | PRN
  Administered 2022-09-20: 4 mg via INTRAVENOUS

## 2022-09-20 MED ORDER — SODIUM CHLORIDE 0.9 % IR SOLN
Status: DC | PRN
  Administered 2022-09-20: 1000 mL

## 2022-09-20 MED ORDER — FENTANYL CITRATE (PF) 250 MCG/5ML IJ SOLN
INTRAMUSCULAR | Status: DC | PRN
  Administered 2022-09-20 (×2): 50 ug via INTRAVENOUS

## 2022-09-20 MED ORDER — GLYCOPYRROLATE 0.2 MG/ML IJ SOLN
INTRAMUSCULAR | Status: DC | PRN
  Administered 2022-09-20 (×2): .1 mg via INTRAVENOUS

## 2022-09-20 MED ORDER — LIDOCAINE 2% (20 MG/ML) 5 ML SYRINGE
INTRAMUSCULAR | Status: DC | PRN
  Administered 2022-09-20: 40 mg via INTRAVENOUS

## 2022-09-20 SURGICAL SUPPLY — 34 items
BAG DRN RND TRDRP ANRFLXCHMBR (UROLOGICAL SUPPLIES)
BAG URINE DRAIN 2000ML AR STRL (UROLOGICAL SUPPLIES) IMPLANT
BLADE CLIPPER SENSICLIP SURGIC (BLADE) ×1 IMPLANT
CATH FOLEY 2WAY SLVR 5CC 16FR (CATHETERS) ×1 IMPLANT
CHARGE TECH PROCEDURE ONCURA (LABOR (TRAVEL & OVERTIME)) ×1 IMPLANT
CLOTH BEACON ORANGE TIMEOUT ST (SAFETY) ×1 IMPLANT
COVER MAYO STAND STRL (DRAPES) ×1 IMPLANT
COVER PROBE U/S 5X48 (MISCELLANEOUS) ×1 IMPLANT
CRYO ENDOCARE PROSTATE CRYO101 (LABOR (TRAVEL & OVERTIME)) ×1 IMPLANT
DRAPE INCISE IOBAN 66X45 STRL (DRAPES) ×1 IMPLANT
DRAPE SHEET LG 3/4 BI-LAMINATE (DRAPES) ×1 IMPLANT
DRAPE U-SHAPE 47X51 STRL (DRAPES) ×1 IMPLANT
DRAPE UNDERBUTTOCKS STRL (DISPOSABLE) ×1 IMPLANT
DRSG TEGADERM 4X4.75 (GAUZE/BANDAGES/DRESSINGS) ×1 IMPLANT
DRSG TEGADERM 8X12 (GAUZE/BANDAGES/DRESSINGS) IMPLANT
GAS ARGON HIGH PRESSURE (MEDICAL GASES) ×1 IMPLANT
GAS HELIUM HIGH PRESSURE (MEDICAL GASES) ×1 IMPLANT
GAUZE SPONGE 4X4 12PLY STRL (GAUZE/BANDAGES/DRESSINGS) IMPLANT
GLOVE BIO SURGEON STRL SZ7.5 (GLOVE) ×1 IMPLANT
GOWN STRL REUS W/TWL XL LVL3 (GOWN DISPOSABLE) ×1 IMPLANT
GUIDEWIRE SUPER STIFF (WIRE) ×1 IMPLANT
HOLDER FOLEY CATH W/STRAP (MISCELLANEOUS) ×1 IMPLANT
IV NS 1000ML (IV SOLUTION) ×2
IV NS 1000ML BAXH (IV SOLUTION) IMPLANT
KIT CRYO ENDOCARE (DISPOSABLE) IMPLANT
KIT EC CRYO PROSTATE CRYO207V (DISPOSABLE) IMPLANT
KIT PROSTATE PRESICE I (KITS) IMPLANT
KIT TURNOVER CYSTO (KITS) ×1 IMPLANT
PACK CYSTO (CUSTOM PROCEDURE TRAY) ×1 IMPLANT
PLUG CATH AND CAP STRL 200 (CATHETERS) ×1 IMPLANT
PROCEDURE CRYO ENDOCARE PROSTE (LABOR (TRAVEL & OVERTIME)) IMPLANT
SLEEVE SCD COMPRESS KNEE MED (STOCKING) ×1 IMPLANT
UNDERPAD 30X36 HEAVY ABSORB (UNDERPADS AND DIAPERS) ×1 IMPLANT
WATER STERILE IRR 500ML POUR (IV SOLUTION) ×1 IMPLANT

## 2022-09-20 NOTE — Discharge Instructions (Addendum)

## 2022-09-20 NOTE — Anesthesia Postprocedure Evaluation (Signed)
Anesthesia Post Note  Patient: Marco Brown  Procedure(s) Performed: CRYO ABLATION PROSTATE (Prostate)     Patient location during evaluation: PACU Anesthesia Type: General Level of consciousness: awake and alert Pain management: pain level controlled Vital Signs Assessment: post-procedure vital signs reviewed and stable Respiratory status: spontaneous breathing, nonlabored ventilation and respiratory function stable Cardiovascular status: blood pressure returned to baseline and stable Postop Assessment: no apparent nausea or vomiting Anesthetic complications: no   No notable events documented.  Last Vitals:  Vitals:   09/20/22 1710 09/20/22 1713  BP:    Pulse: 64 64  Resp: 15 16  Temp:  (!) 36.3 C  SpO2: 98% 100%    Last Pain:  Vitals:   09/20/22 1713  TempSrc: Oral  PainSc:                  Lowella Curb

## 2022-09-20 NOTE — Anesthesia Preprocedure Evaluation (Addendum)
Anesthesia Evaluation  Patient identified by MRN, date of birth, ID band Patient awake    Reviewed: Allergy & Precautions, NPO status , Patient's Chart, lab work & pertinent test results  Airway Mallampati: II  TM Distance: >3 FB Neck ROM: Full    Dental  (+) Teeth Intact   Pulmonary sleep apnea , former smoker   breath sounds clear to auscultation       Cardiovascular hypertension, Pt. on home beta blockers and Pt. on medications + CAD and + Cardiac Stents  + dysrhythmias  Rhythm:Regular Rate:Normal     Neuro/Psych negative neurological ROS  negative psych ROS   GI/Hepatic Neg liver ROS,GERD  ,,  Endo/Other  diabetes, Type 2, Oral Hypoglycemic AgentsHypothyroidism    Renal/GU Renal disease     Musculoskeletal  (+) Arthritis ,    Abdominal   Peds  Hematology negative hematology ROS (+)   Anesthesia Other Findings   Reproductive/Obstetrics                             Anesthesia Physical Anesthesia Plan  ASA: 3  Anesthesia Plan: General   Post-op Pain Management: Tylenol PO (pre-op)*   Induction: Intravenous  PONV Risk Score and Plan: 3 and Ondansetron and Treatment may vary due to age or medical condition  Airway Management Planned: Oral ETT and LMA  Additional Equipment: None  Intra-op Plan:   Post-operative Plan: Extubation in OR  Informed Consent: I have reviewed the patients History and Physical, chart, labs and discussed the procedure including the risks, benefits and alternatives for the proposed anesthesia with the patient or authorized representative who has indicated his/her understanding and acceptance.     Dental advisory given  Plan Discussed with: CRNA  Anesthesia Plan Comments:        Anesthesia Quick Evaluation

## 2022-09-20 NOTE — Op Note (Signed)
Pre-operative diagnosis : Adenocarcinoma prostate, T1c  Postoperative diagnosis:  Same  Operation:  Cryotherapy of the prostate  Surgeon: Wilkie Aye, MD  Anesthesia:  General  Drains: 18 French foley catheter  Specimen: None  EBL: Minimal  Findings: 24.9cc prostate gland. No evidence of cryoprobes in the urethra or bladder on cystoscopy. Complete ablation of the prostate with 6 cryoablation probes.  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 He is status post brachytherapy for his initial prostate cancer treatment.  After discussing treatment options the patient elected to proceed with cryoablation of the prostate.  He has erectile dysfunction already.  Procedure in detail: Prior to procedure consent was obtained. The patient was brought to the operating room and a brief timeout was done ensuring the correct patient, correct procedure, and correct site.  After appropriate anesthesia, the patient was placed on the operative table in dorsal supine position where general LMA anesthesia was introduced. He was then placed in the dorsal lithotomy position with the pubis was prepped with Betadine solution and draped in usual fashion.    An 84 French Foley catheter is placed with 10 mL in the balloon. The patient then underwent 6 rows of cryotherapy probe insertion, under real time ultrasound control. When all the cryotherapy probes were placed, temperature probes were placed into an denonvilliers fascia, and periurethral tissue. Following this, cystourethroscopy was performed and this revealed no probe within the bladder or urethra. A guidewire was placed in the bladder through the flexible cystoscope prior to removal. Over the guidewire, the urethral warming device was placed, and a cap placed on the end. Following this, with the urethral warming device in place, the patient underwent 2 separate freeze-thaw cycles, using Argon gas and  Helium gas. The second cycle used active freezing, and active thaw process at the beginning, but then passive thaw process to finish. The urethral warming device was left in place for 20 extra minutes, prior to removal, and replacement with an 18 French Foley catheter placed to straight drainage with 10 mL of sterile water in the balloon. The patient received IV Tylenol and IV Toradol. This then concluded the procedure which was well tolerated by the patient. He was awakened and taken to recovery room in good condition.  Condition: Stable, extubated, transferred to PACU  Complications: None  Plan: Patient is to be discharged home. He is to followup in 1 week for foley catheter removal

## 2022-09-20 NOTE — Transfer of Care (Signed)
Immediate Anesthesia Transfer of Care Note  Patient: Marco Brown  Procedure(s) Performed: CRYO ABLATION PROSTATE (Prostate)  Patient Location: PACU  Anesthesia Type:General  Level of Consciousness: awake, alert , and oriented  Airway & Oxygen Therapy: Patient Spontanous Breathing and Patient connected to nasal cannula oxygen  Post-op Assessment: Report given to RN and Post -op Vital signs reviewed and stable  Post vital signs: Reviewed and stable  Last Vitals:  Vitals Value Taken Time  BP 178/95 09/20/22 1641  Temp    Pulse 71 09/20/22 1644  Resp 11 09/20/22 1644  SpO2 100 % 09/20/22 1644  Vitals shown include unvalidated device data.  Last Pain:  Vitals:   09/20/22 1118  TempSrc: Oral  PainSc: 0-No pain      Patients Stated Pain Goal: 5 (09/20/22 1118)  Complications: No notable events documented.
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
## 2022-09-20 NOTE — Anesthesia Procedure Notes (Signed)
Procedure Name: Intubation Date/Time: 09/20/2022 3:06 PM  Performed by: Dairl Ponder, CRNAPre-anesthesia Checklist: Patient identified, Emergency Drugs available, Suction available and Patient being monitored Patient Re-evaluated:Patient Re-evaluated prior to induction Oxygen Delivery Method: Circle System Utilized Preoxygenation: Pre-oxygenation with 100% oxygen Induction Type: IV induction Ventilation: Mask ventilation without difficulty Laryngoscope Size: Mac and 4 Grade View: Grade III Tube type: Oral Tube size: 7.5 mm Number of attempts: 1 Airway Equipment and Method: Stylet and Oral airway Placement Confirmation: positive ETCO2 and breath sounds checked- equal and bilateral Secured at: 23 cm Tube secured with: Tape Dental Injury: Teeth and Oropharynx as per pre-operative assessment

## 2022-09-20 NOTE — OR Nursing (Signed)
Patient and patients wife instructed on how to empty foley cathetor and care of foley cathetor. Margarita Mail rn

## 2022-09-21 ENCOUNTER — Encounter (HOSPITAL_BASED_OUTPATIENT_CLINIC_OR_DEPARTMENT_OTHER): Payer: Self-pay | Admitting: Urology

## 2022-10-02 DIAGNOSIS — E7800 Pure hypercholesterolemia, unspecified: Secondary | ICD-10-CM | POA: Insufficient documentation

## 2022-10-02 DIAGNOSIS — I2510 Atherosclerotic heart disease of native coronary artery without angina pectoris: Secondary | ICD-10-CM | POA: Insufficient documentation

## 2022-10-02 DIAGNOSIS — I259 Chronic ischemic heart disease, unspecified: Secondary | ICD-10-CM | POA: Insufficient documentation
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
## 2022-10-04 ENCOUNTER — Encounter: Payer: Self-pay | Admitting: Urology

## 2022-10-04 ENCOUNTER — Ambulatory Visit: Payer: Medicare PPO | Admitting: Urology

## 2022-10-04 VITALS — BP 124/70 | HR 67 | Temp 98.1°F

## 2022-10-04 DIAGNOSIS — Z09 Encounter for follow-up examination after completed treatment for conditions other than malignant neoplasm: Secondary | ICD-10-CM

## 2022-10-04 DIAGNOSIS — Z9889 Other specified postprocedural states: Secondary | ICD-10-CM

## 2022-10-04 DIAGNOSIS — C61 Malignant neoplasm of prostate: Secondary | ICD-10-CM

## 2022-10-04 DIAGNOSIS — Z08 Encounter for follow-up examination after completed treatment for malignant neoplasm: Secondary | ICD-10-CM

## 2022-10-04 MED ORDER — CIPROFLOXACIN HCL 500 MG PO TABS
500.0000 mg | ORAL_TABLET | Freq: Once | ORAL | Status: AC
Start: 2022-10-04 — End: 2022-10-04
  Administered 2022-10-04: 500 mg via ORAL

## 2022-10-04 NOTE — Progress Notes (Signed)
Fill and Pull Catheter Removal  Patient is present today for a catheter removal.  Patient was cleaned and prepped in a sterile fashion 300 ml of sterile water/ saline was instilled into the bladder when the patient felt the urge to urinate. 10 ml of water was then drained from the balloon.  A 16 FR foley cath was removed from the bladder no complications were noted .  Patient as then given some time to void on their own.  Patient can void  300 ml on their own after some time.  Patient tolerated well.  Performed by: Kennyth Lose, CMA  Follow up/ Additional notes: keep follow up appointment

## 2022-12-31 ENCOUNTER — Other Ambulatory Visit: Payer: Medicare PPO

## 2022-12-31 DIAGNOSIS — C61 Malignant neoplasm of prostate: Secondary | ICD-10-CM

## 2023-01-01 LAB — PSA: Prostate Specific Ag, Serum: <0.1 ng/mL (ref 0.0–4.0)

## 2023-01-04 ENCOUNTER — Ambulatory Visit: Payer: Medicare PPO | Admitting: Urology

## 2023-01-04 VITALS — BP 121/68 | HR 76

## 2023-01-04 DIAGNOSIS — C61 Malignant neoplasm of prostate: Secondary | ICD-10-CM

## 2023-01-04 DIAGNOSIS — R351 Nocturia: Secondary | ICD-10-CM

## 2023-01-04 DIAGNOSIS — N401 Enlarged prostate with lower urinary tract symptoms: Secondary | ICD-10-CM

## 2023-01-04 LAB — URINALYSIS, ROUTINE W REFLEX MICROSCOPIC
Bilirubin, UA: NEGATIVE
Ketones, UA: NEGATIVE
Leukocytes,UA: NEGATIVE
Nitrite, UA: NEGATIVE
Protein,UA: NEGATIVE
RBC, UA: NEGATIVE
Specific Gravity, UA: 1.015 (ref 1.005–1.030)
Urobilinogen, Ur: 0.2 mg/dL (ref 0.2–1.0)
pH, UA: 7 (ref 5.0–7.5)

## 2023-01-04 MED ORDER — CLOTRIMAZOLE-BETAMETHASONE 1-0.05 % EX CREA: 1.0000 | TOPICAL_CREAM | Freq: Two times a day (BID) | CUTANEOUS | 3 refills | Status: AC

## 2023-01-04 MED ORDER — CLOTRIMAZOLE-BETAMETHASONE 1-0.05 % EX CREA: 1.0000 | TOPICAL_CREAM | Freq: Two times a day (BID) | CUTANEOUS | 3 refills | Status: DC

## 2023-01-04 NOTE — Patient Instructions (Signed)
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
## 2023-01-08 ENCOUNTER — Encounter: Payer: Self-pay | Admitting: Urology

## 2023-03-26 ENCOUNTER — Other Ambulatory Visit: Payer: Medicare PPO

## 2023-03-26 DIAGNOSIS — C61 Malignant neoplasm of prostate: Secondary | ICD-10-CM

## 2023-03-27 LAB — PSA: Prostate Specific Ag, Serum: <0.1 ng/mL (ref 0.0–4.0)

## 2023-04-04 ENCOUNTER — Telehealth: Payer: Self-pay

## 2023-04-04 NOTE — Telephone Encounter (Signed)
 Patient called stating his PSA was normal and did not see a need to make the trip all the way here just to have you say it was good and see you back in 3-6 months. Patient request to cancel appointment tomorrow and follow up later.   When would you like to see patient back?

## 2023-04-05 ENCOUNTER — Ambulatory Visit: Payer: Medicare PPO | Admitting: Urology

## 2023-05-21 ENCOUNTER — Other Ambulatory Visit: Payer: Self-pay

## 2023-05-21 DIAGNOSIS — C61 Malignant neoplasm of prostate: Secondary | ICD-10-CM

## 2023-06-14 ENCOUNTER — Other Ambulatory Visit: Payer: Medicare PPO

## 2023-06-15 LAB — PSA: Prostate Specific Ag, Serum: <0.1 ng/mL (ref 0.0–4.0)

## 2023-06-21 ENCOUNTER — Ambulatory Visit: Payer: Medicare PPO | Admitting: Urology

## 2023-06-21 ENCOUNTER — Encounter: Payer: Self-pay | Admitting: Urology

## 2023-06-21 VITALS — BP 164/84 | HR 67

## 2023-06-21 DIAGNOSIS — R351 Nocturia: Secondary | ICD-10-CM

## 2023-06-21 DIAGNOSIS — C61 Malignant neoplasm of prostate: Secondary | ICD-10-CM | POA: Diagnosis not present

## 2023-06-21 DIAGNOSIS — N401 Enlarged prostate with lower urinary tract symptoms: Secondary | ICD-10-CM

## 2023-06-21 DIAGNOSIS — N138 Other obstructive and reflux uropathy: Secondary | ICD-10-CM

## 2023-06-21 DIAGNOSIS — N5201 Erectile dysfunction due to arterial insufficiency: Secondary | ICD-10-CM | POA: Diagnosis not present

## 2023-06-21 LAB — URINALYSIS, ROUTINE W REFLEX MICROSCOPIC
Bilirubin, UA: NEGATIVE
Glucose, UA: NEGATIVE
Ketones, UA: NEGATIVE
Leukocytes,UA: NEGATIVE
Nitrite, UA: NEGATIVE
RBC, UA: NEGATIVE
Specific Gravity, UA: 1.03 (ref 1.005–1.030)
Urobilinogen, Ur: 0.2 mg/dL (ref 0.2–1.0)
pH, UA: 6 (ref 5.0–7.5)

## 2023-06-21 LAB — MICROSCOPIC EXAMINATION
Bacteria, UA: NONE SEEN
WBC, UA: NONE SEEN /HPF (ref 0–5)

## 2023-06-21 NOTE — Patient Instructions (Signed)
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
## 2023-12-24 ENCOUNTER — Other Ambulatory Visit

## 2024-01-01 ENCOUNTER — Ambulatory Visit: Admitting: Urology

## 2024-04-09 ENCOUNTER — Other Ambulatory Visit

## 2024-04-20 ENCOUNTER — Ambulatory Visit: Admitting: Urology

## 2024-04-22 ENCOUNTER — Telehealth: Payer: Self-pay

## 2024-04-22 DIAGNOSIS — C61 Malignant neoplasm of prostate: Secondary | ICD-10-CM
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
## 2025-04-13 ENCOUNTER — Other Ambulatory Visit

## 2025-04-21 ENCOUNTER — Ambulatory Visit: Admitting: Urology
